# Patient Record
Sex: Female | Born: 1960 | Race: White | Smoking: Never smoker
Health system: Northeastern US, Academic
[De-identification: ages and names within clinical notes are randomized; demographics above are authoritative.]

## PROBLEM LIST (undated history)

## (undated) DIAGNOSIS — E119 Type 2 diabetes mellitus without complications: Secondary | ICD-10-CM

## (undated) HISTORY — PX: KNEE SURGERY: SHX244

## (undated) HISTORY — DX: Type 2 diabetes mellitus without complications: E11.9

---

## 2007-05-24 ENCOUNTER — Emergency Department (HOSPITAL_COMMUNITY): Admission: EM | Admit: 2007-05-24 | Discharge: 2007-05-25 | Payer: Self-pay | Admitting: Emergency Medicine

## 2008-03-12 ENCOUNTER — Emergency Department (HOSPITAL_COMMUNITY): Admission: EM | Admit: 2008-03-12 | Discharge: 2008-03-12 | Payer: Self-pay | Admitting: Emergency Medicine

## 2008-07-10 ENCOUNTER — Emergency Department (HOSPITAL_COMMUNITY): Admission: EM | Admit: 2008-07-10 | Discharge: 2008-07-10 | Payer: Self-pay | Admitting: Emergency Medicine

## 2008-07-26 ENCOUNTER — Other Ambulatory Visit: Payer: Self-pay

## 2008-07-26 ENCOUNTER — Encounter: Payer: Self-pay | Admitting: Cardiology

## 2008-08-05 ENCOUNTER — Encounter: Payer: Self-pay | Admitting: Cardiology

## 2008-08-06 ENCOUNTER — Encounter: Payer: Self-pay | Admitting: Cardiology

## 2008-08-06 ENCOUNTER — Other Ambulatory Visit: Payer: Self-pay | Admitting: Cardiology

## 2008-08-07 ENCOUNTER — Encounter: Payer: Self-pay | Admitting: Cardiology

## 2008-08-08 ENCOUNTER — Other Ambulatory Visit: Payer: Self-pay | Admitting: Cardiology

## 2008-08-08 ENCOUNTER — Encounter: Payer: Self-pay | Admitting: Cardiology

## 2008-08-09 ENCOUNTER — Encounter: Payer: Self-pay | Admitting: Cardiology

## 2008-08-10 ENCOUNTER — Encounter: Payer: Self-pay | Admitting: Cardiology

## 2008-08-17 ENCOUNTER — Encounter: Payer: Self-pay | Admitting: Cardiology

## 2008-08-18 ENCOUNTER — Encounter: Payer: Self-pay | Admitting: Cardiology

## 2008-08-19 ENCOUNTER — Encounter: Payer: Self-pay | Admitting: Cardiology

## 2008-08-23 ENCOUNTER — Encounter: Payer: Self-pay | Admitting: Cardiology

## 2008-10-09 ENCOUNTER — Other Ambulatory Visit: Payer: Self-pay

## 2008-10-24 ENCOUNTER — Other Ambulatory Visit: Payer: Self-pay | Admitting: Cardiology

## 2008-10-24 ENCOUNTER — Encounter: Payer: Self-pay | Admitting: Cardiology

## 2008-10-25 ENCOUNTER — Encounter: Payer: Self-pay | Admitting: Cardiology

## 2008-10-26 ENCOUNTER — Encounter: Payer: Self-pay | Admitting: Cardiology

## 2008-10-28 ENCOUNTER — Other Ambulatory Visit: Payer: Self-pay | Admitting: Cardiology

## 2008-10-28 ENCOUNTER — Encounter: Payer: Self-pay | Admitting: Cardiology

## 2008-10-29 ENCOUNTER — Encounter: Payer: Self-pay | Admitting: Cardiology

## 2008-10-31 ENCOUNTER — Encounter: Payer: Self-pay | Admitting: Cardiology

## 2008-11-01 ENCOUNTER — Encounter: Payer: Self-pay | Admitting: Cardiology

## 2008-11-02 ENCOUNTER — Encounter: Payer: Self-pay | Admitting: Cardiology

## 2008-11-03 ENCOUNTER — Encounter: Payer: Self-pay | Admitting: Cardiology

## 2008-11-04 ENCOUNTER — Encounter: Payer: Self-pay | Admitting: Cardiology

## 2008-11-05 ENCOUNTER — Encounter: Payer: Self-pay | Admitting: Cardiology

## 2008-11-10 ENCOUNTER — Encounter: Payer: Self-pay | Admitting: Cardiology

## 2008-11-12 ENCOUNTER — Encounter: Payer: Self-pay | Admitting: Cardiology

## 2008-11-13 ENCOUNTER — Encounter: Payer: Self-pay | Admitting: Cardiology

## 2008-11-14 ENCOUNTER — Encounter: Payer: Self-pay | Admitting: Cardiology

## 2008-11-15 ENCOUNTER — Encounter: Payer: Self-pay | Admitting: Cardiovascular Disease

## 2008-11-27 ENCOUNTER — Other Ambulatory Visit: Payer: Self-pay

## 2009-08-18 ENCOUNTER — Ambulatory Visit: Payer: Self-pay

## 2009-08-28 ENCOUNTER — Ambulatory Visit: Payer: Self-pay

## 2009-09-19 ENCOUNTER — Ambulatory Visit: Payer: Self-pay

## 2009-09-30 ENCOUNTER — Ambulatory Visit: Payer: Self-pay

## 2009-11-12 ENCOUNTER — Ambulatory Visit: Payer: Self-pay

## 2009-12-02 ENCOUNTER — Ambulatory Visit: Payer: Self-pay

## 2010-01-28 ENCOUNTER — Ambulatory Visit: Payer: Self-pay | Admitting: Ophthalmology

## 2010-02-04 ENCOUNTER — Ambulatory Visit: Payer: Self-pay

## 2010-02-23 ENCOUNTER — Ambulatory Visit: Payer: Self-pay

## 2010-03-02 ENCOUNTER — Ambulatory Visit: Payer: Self-pay

## 2010-03-13 ENCOUNTER — Ambulatory Visit: Payer: Self-pay

## 2010-03-30 ENCOUNTER — Ambulatory Visit: Payer: Self-pay

## 2010-04-09 ENCOUNTER — Ambulatory Visit: Payer: Self-pay

## 2010-04-27 ENCOUNTER — Ambulatory Visit: Payer: Self-pay | Admitting: Ophthalmology

## 2010-05-04 ENCOUNTER — Ambulatory Visit: Payer: Self-pay

## 2010-05-12 ENCOUNTER — Ambulatory Visit: Payer: Self-pay | Admitting: Ophthalmology

## 2010-05-25 ENCOUNTER — Ambulatory Visit: Payer: Self-pay

## 2010-06-24 ENCOUNTER — Ambulatory Visit: Payer: Self-pay | Admitting: Ophthalmology

## 2010-07-08 ENCOUNTER — Ambulatory Visit: Payer: Self-pay

## 2010-07-10 ENCOUNTER — Ambulatory Visit: Payer: Self-pay

## 2010-07-17 ENCOUNTER — Ambulatory Visit: Payer: Self-pay

## 2010-07-24 ENCOUNTER — Ambulatory Visit: Payer: Self-pay

## 2010-07-30 ENCOUNTER — Ambulatory Visit: Payer: Self-pay

## 2010-08-17 ENCOUNTER — Ambulatory Visit: Payer: Self-pay

## 2010-09-02 ENCOUNTER — Ambulatory Visit: Payer: Self-pay

## 2010-09-22 ENCOUNTER — Ambulatory Visit: Payer: Self-pay

## 2011-06-23 ENCOUNTER — Ambulatory Visit: Payer: Self-pay | Admitting: Ophthalmology

## 2011-06-23 ENCOUNTER — Encounter: Payer: Self-pay | Admitting: Ophthalmology

## 2011-06-23 DIAGNOSIS — H524 Presbyopia: Secondary | ICD-10-CM

## 2011-06-23 DIAGNOSIS — E11319 Type 2 diabetes mellitus with unspecified diabetic retinopathy without macular edema: Secondary | ICD-10-CM

## 2011-06-23 HISTORY — DX: Presbyopia: H52.4

## 2011-06-23 HISTORY — DX: Type 2 diabetes mellitus with unspecified diabetic retinopathy without macular edema: E11.319

## 2011-06-23 NOTE — Progress Notes (Signed)
Outpatient Visit      Patient name: Sherri Harrison  DOB: 03/03/1961       Age: 50 y.o.  MR#: [id number]    Encounter Date: 06/23/2011    Subjective:     Chief Complaint: No chief complaint on file.    HPI     50 year old returns to monitor presbyopia and diabetic retinopathy.  No   change.  Insulin dependent and on dialysis.  Did not fill last MR.          No current outpatient prescriptions on file. (Ophthalmic)     Current Outpatient Prescriptions (Other)   Medication Sig Dispense Refill   . Sevelamer Carbonate (RENVELA PO) Take by mouth                 is allergic to sulfa drugs.      Past Medical History   Diagnosis Date   . Presbyopia 06/23/2011   . Diabetic retinopathy 06/23/2011   . Diabetes mellitus         History reviewed. No pertinent past surgical history.   History   Smoking status   . Never Smoker    Smokeless tobacco   . Not on file      History   Alcohol Use: Not on file      History   Drug Use Not on file      Specialty Problems             Ophthalmology Problems    Diabetic retinopathy               ROS     Positive for: Endocrine, Eyes      Objective:     Base Ophthalmology Exam        Visual Acuity    Right Left   Dist cc 20/30 -2 20/40 +1   Method: Snellen - Linear   Correction: Glasses   Tonometry    Right Left   Pressure 18 20   Method: Tonopen   Time: 4:49 PM   Wearing Rx    Sphere Cylinder Axis   Right -2.25 +1.00 165   Left -2.75 +1.75 180   Type: SVL    Comments: Trial frame   Manifest Refraction    Sphere Cylinder Axis   Right -2.25 +1.00 165   Left -2.75 +1.75 180   Dilation   Both eyes: 2.5% Phenylephrine, 1.0% Tropicamide @ 4:50 PM   Pupils    Pupils   Right PERRLA   Left PERRLA   Visual Fields    Left Right   Result Full Full   Extraocular Movement    Right Left   Result Full Full   Final Rx    Sphere Cylinder Axis   Right -2.25 +1.00 165   Left -2.75 +1.75 180    Comments: Single vision lenses      Main Ophthalmology Exam        External Exam    Right Left   External Normal Normal      Slit Lamp Exam    Right Left   Lids/Lashes Normal Normal   Conjunctiva/Sclera White and quiet White and quiet   Cornea Clear Clear   Anterior Chamber Deep and quiet Deep and quiet   Iris Round and reactive Round and reactive   Lens Clear Clear   Vitreous Normal Normal   Fundus Exam    Right Left   Disc Normal Normal   C/D Ratio 0.3 0.3  Macula Normal Normal   Vessels Normal Normal   Periphery Laser scars Normal       Neuro/Psych   Oriented x3: Yes   Mood/Affect: Normal         Final Rx    Sphere Cylinder Axis   Right -2.25 +1.00 165   Left -2.75 +1.75 180    Comments: Single vision lenses              No annotated images are attached to the encounter.      Assessment/Plan:     Assessment:   1. Presbyopia    2. Diabetic retinopathy         Had severe diabetic retinopathy during pregnancy and had PRP done in 1980.  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  There is no active retinopathy  Recommend  MR single vision lenses  If contacts may need mono vision

## 2011-07-26 LAB — DIFFERENTIAL
Basophils Relative: 1
Eosinophils Absolute: 0
Monocytes Absolute: 0.5
Monocytes Relative: 5

## 2011-07-26 LAB — HEPATIC FUNCTION PANEL
Bilirubin, Direct: 0.5 — ABNORMAL HIGH
Indirect Bilirubin: 0.1 — ABNORMAL LOW
Total Bilirubin: 0.6

## 2011-07-26 LAB — BASIC METABOLIC PANEL
CO2: 28
Chloride: 102
Creatinine, Ser: 0.55
GFR calc Af Amer: >60
Sodium: 135

## 2011-07-26 LAB — URINALYSIS, ROUTINE W REFLEX MICROSCOPIC
Glucose, UA: NEGATIVE
Ketones, ur: NEGATIVE
Leukocytes, UA: NEGATIVE
Protein, ur: NEGATIVE
Urobilinogen, UA: 0.2

## 2011-07-26 LAB — CBC
Hemoglobin: 13.3
MCHC: 34.2
MCV: 89.9
RBC: 4.34

## 2011-07-26 LAB — POCT CARDIAC MARKERS: Operator id: 1415

## 2011-07-26 LAB — LIPASE, BLOOD: Lipase: 22

## 2011-11-24 ENCOUNTER — Ambulatory Visit: Payer: Self-pay

## 2011-11-24 DIAGNOSIS — Z89519 Acquired absence of unspecified leg below knee: Secondary | ICD-10-CM

## 2011-11-24 NOTE — Progress Notes (Addendum)
Patient seen with daughter present for follow up on left transtibial prosthesis.    Patient reports low back pain 8/10, which she feels may be due to LLD or core atrophy.    Prosthesis:  Original doesn't hurt in the knee area, suspension sleeve appears worn.  She would like a new suspension sleeve (Ossur size 41).    New prosthesis:  Has not worn new prosthesis consistently since receiving it.  After wearing 20 min she feels pressure under the knee cap.  Feels that Trimlines are too high in the posterior and on the sides.  Most recently wore her new prosthesis about a month ago.  She said that the foot externally rotated while she was wearing it.  She said that she didn't know if the socket rotated as well, or if it was just the foot.  Wears a 3 ply sock, but hasn't worn more than that.  Encouraged patient to increase her sock ply as it is likely that her limb is going too far into the socket.  She said she only has a few single ply and three ply socks. New prosthesis was inspected and the rotation was secure.     Patient stood with her new prosthesis.  It appeared that the foot was outset, which may be causing her pain at the medial condyle.  The prosthesis was adducted distal to the socket to decrease the outset.  End range of the pyramid was utilized.  The foot was also everted to allow the foot to be flat on the floor.  The posterior trimline was cut down by 2 mm to provide relief.   Patient walked with the prosthesis and reported that it felt better.  She also requested that the gel liner from her old prosthesis be trimmed down in the back, as it has rolled over and is causing bunching in the popliteal fossa.  The liner was trimmed down in the posterior region to reduce the bunching.  Patient stated satisfaction with the adjustment.  All screws set to torque specifications and secured with loktite.    Patient to return for follow up in 3 weeks to assess alignment changes and socket fit.

## 2011-12-16 ENCOUNTER — Ambulatory Visit: Payer: Self-pay

## 2011-12-16 DIAGNOSIS — Z89519 Acquired absence of unspecified leg below knee: Secondary | ICD-10-CM

## 2011-12-16 NOTE — Progress Notes (Signed)
Patient seen with husband present for follow up on left transtibial prosthesis.    Subjective:    Patient stated:    -New prosthesis feels tall (maybe causing LBP), foot toed too far out, foot feels like it is off the floor in quiet standing and feels like she's being thrown forward.  -Old prosthesis needs suspension sleeve replaced and foot externally rotated  -would like a shorter prosthetic sock in 3 ply    Objective:    New prosthesis was assessed.  Foot was dorsiflexed by 1 turn.  Patient was able to stand quietly without the sensation of being thrown forward.  The foot appeared to be flat on the floor.  Toe out angle was assessed and reduced to match the sound side.  Patient stated that her old prosthesis feels appropriate for height.  Patient's height was assessed and her pelvis appeared level.  Both prostheses were taken and their heights were compared.  Old prosthesis measured 40 cm from floor to patellar tendon bar.  New prosthesis measured 41 cm.  Patient stated that she preferred the height of the old device.  Patient requested that the height of the new prosthesis be matched to that of the old one.  The new prosthesis was shortened by 1 cm to match the old prosthesis.  Patient stated that it felt too short.  Patient encouraged to wear the prosthesis and determine over the course of several days whether the height is appropriate.  Patient stated agreement with the plan.  All screws set to torque specs and secured with loctite.    Patient complained of socket pressure in the new prosthesis.  Patient encouraged to manage her sock ply more diligently and to use single ply socks for fine adjustments.  Patient stated understanding.    Old prosthesis was donned with a new Ossur Iceflex Endurance suspension sleeve in size 41 beige (part no. [id number]).  Patient stated concern that the foot of the old prosthesis was externally rotated.  The toeout angle was decreased to match the sound side.  Patient walked  several laps in the room and the socket appeared to rotate on her limb based on which direction she pivoted.  The patient requested that the foot be externally rotated.  The foot was restored to the initial degree of toeout as evidenced by a mark placed on the pylon and tube connector.  Patient stated satisfaction with the orientation of the foot.  Patient showed a valgus moment at the knee during stance.  Patient complained of pressure indicative of a varus moment.  Patient refused alignment adjustments.  All screws set to torque specs and secured with loctite.    Assessment:    Patient to benefit from alignment changes and sock ply management to improve fit and function of both prostheses.    Plan:    Patient to wear her prostheses and determine if adjustments were beneficial and return for follow up in 2-3 weeks.

## 2012-05-29 ENCOUNTER — Telehealth: Payer: Self-pay

## 2012-05-29 NOTE — Telephone Encounter (Signed)
Per [facility]sw[facility] pt called stating she has questions re: the program. No call back number left.

## 2012-06-06 ENCOUNTER — Other Ambulatory Visit (HOSPITAL_COMMUNITY)
Admission: RE | Admit: 2012-06-06 | Discharge: 2012-06-06 | Disposition: A | Discharge status: Home / Self Care | Payer: BC Managed Care – PPO | Source: Ambulatory Visit | Attending: Family Medicine | Admitting: Family Medicine

## 2012-06-06 DIAGNOSIS — Z124 Encounter for screening for malignant neoplasm of cervix: Secondary | ICD-10-CM | POA: Insufficient documentation

## 2012-06-27 ENCOUNTER — Encounter: Payer: Self-pay | Admitting: Ophthalmology

## 2012-06-27 ENCOUNTER — Ambulatory Visit: Payer: Self-pay | Admitting: Ophthalmology

## 2012-06-27 VITALS — BP 150/77 | HR 84

## 2012-06-27 DIAGNOSIS — E1029 Type 1 diabetes mellitus with other diabetic kidney complication: Secondary | ICD-10-CM

## 2012-06-27 DIAGNOSIS — E11319 Type 2 diabetes mellitus with unspecified diabetic retinopathy without macular edema: Secondary | ICD-10-CM

## 2012-06-27 NOTE — Progress Notes (Signed)
Outpatient Visit      Patient name: Sherri Harrison  DOB: 1961-09-10       Age: 51 y.o.  MR#: [id number]    Encounter Date: 06/27/2012    Subjective:     Chief Complaint:   Chief Complaint   Patient presents with   . Follow-up     1 year follow-up for presbyopia and DR per Dr. Gretel Acre     HPI    Follow-up Additional comments: 1 year follow-up for presbyopia and DR per   Dr. Kenlie Seki        Comments84 year old returns to monitor presbyopia and diabetic   retinopathy.  Patient states that she is not noticing any change in her   vision since she was here last visit.  She denies any ocular pain or   discomfort in either eye.  IDDM X 40 years BG range 68-321 over the past   two weeks.  Last Hgb A1c 5.7              is allergic to sulfa drugs.      Past Medical History   Diagnosis Date   . Presbyopia 06/23/2011   . Diabetic retinopathy 06/23/2011   . Diabetes mellitus         History reviewed. No pertinent past surgical history.   History   Smoking status   . Never Smoker    Smokeless tobacco   . Not on file      History   Alcohol Use: Not on file      History   Drug Use Not on file      Specialty Problems       Ophthalmology Problems    Diabetic retinopathy        Presbyopia        DM (diabetes mellitus) type I controlled with renal manifestation               ROS    Positive for: Endocrine (diabetes)    Negative for: Constitutional, Gastrointestinal, Neurological, Skin,   Genitourinary, Musculoskeletal, HENT, Cardiovascular, Eyes, Respiratory,   Psychiatric, Allergic/Imm, Heme/Lymph      Objective:     Base Eye Exam       Visual Acuity    Right Left   Dist cc 20/40 -2 20/30 -2   Dist ph cc 20/30-2 NI   Method: Snellen - Linear   Correction: Glasses   Tonometry    Right Left   Pressure 18 19   Method: Tonopen   Time: 2:45 PM   Pupils    Dark Light APD   Right 2 1.75 None   Left 3.25 2.75 None   Visual Fields    Left Right   Result Full Full   Extraocular Movement    Right Left   Result Full Full   Neuro/Psych   Oriented x3:  Yes   Mood/Affect: Normal   Dilation   Both eyes: 2.5% Phenylephrine @ 2:27 PM      Slit Lamp and Fundus Exam       External Exam    Right Left   External Normal Normal   Slit Lamp Exam    Right Left   Lids/Lashes Normal Normal   Conjunctiva/Sclera White and quiet White and quiet   Cornea Clear Clear   Anterior Chamber Deep and quiet Deep and quiet   Iris Round and reactive Round and reactive   Lens Trace Nuclear sclerosis, 1+ Posterior subcapsular cataract Clear, Trace Nuclear sclerosis,  1+ Posterior subcapsular cataract   Vitreous Normal Normal   Fundus Exam    Right Left   Disc Normal Normal   C/D Ratio 0.3 0.3   Macula Normal Normal   Vessels Normal Normal   Periphery Laser scars Normal      Refraction       Wearing Rx    Sphere Cylinder Axis   Right -2.25 +1.00 165   Left -2.75 +1.75 180   Type: SVL   Manifest Refraction    Sphere Cylinder Axis Dist   Right -2.50 +0.75 155 20/-   Left                    No annotated images are attached to the encounter.      Assessment/Plan:     Assessment:   1. Diabetic retinopathy    2. Presbyopia    3. DM (diabetes mellitus) type I controlled with renal manifestation    4. CRI (chronic renal insufficiency)       Presbyopia  No change in MRx    Diabetic retinopathy  Gestational proliferative change with unilateral PRP 1980  No proliferative disease    Diabetes with renal insufficiency  Peritoneal dialysis since 2008    Cataract  Minor   Only anatomically notable,  No functional limitation    Plan  Wants consider one eye for distance with lasik  Had done monovision lasik and to use the other eye for distance

## 2012-07-18 ENCOUNTER — Telehealth: Payer: Self-pay | Admitting: Ophthalmology

## 2012-07-18 NOTE — Telephone Encounter (Signed)
Left message for patient

## 2012-07-18 NOTE — Telephone Encounter (Signed)
Sherri Harrison doctor wants to put her on an asprin regimen.  Will this have a negative effect on her eyes?

## 2012-10-19 ENCOUNTER — Other Ambulatory Visit: Payer: Self-pay | Admitting: Gastroenterology

## 2012-10-19 ENCOUNTER — Encounter: Payer: Self-pay | Admitting: Registered"

## 2012-10-19 ENCOUNTER — Other Ambulatory Visit: Payer: Self-pay | Admitting: Nephrology

## 2012-10-19 ENCOUNTER — Encounter: Payer: Self-pay | Admitting: Transplant Surgery Liver and Kidney

## 2012-10-19 ENCOUNTER — Ambulatory Visit: Payer: Self-pay | Admitting: Nephrology

## 2012-10-19 VITALS — BP 183/81 | HR 107 | Ht 61.5 in | Wt 155.6 lb

## 2012-10-19 DIAGNOSIS — Z01818 Encounter for other preprocedural examination: Secondary | ICD-10-CM

## 2012-10-19 LAB — HEPATIC FUNCTION PANEL
ALT: 20 U/L (ref 0–35)
AST: 29 U/L (ref 0–35)
Albumin: 3.2 g/dL — ABNORMAL LOW (ref 3.5–5.2)
Alk Phos: 99 U/L (ref 35–105)
Bilirubin,Direct: <0.2 mg/dL (ref 0.0–0.3)
Bilirubin,Total: 0.4 mg/dL (ref 0.0–1.2)
Total Protein: 7.7 g/dL (ref 6.3–7.7)

## 2012-10-19 LAB — CBC AND DIFFERENTIAL
Baso # K/uL: 0.1 10*3/uL (ref 0.0–0.1)
Basophil %: 1.1 % (ref 0.1–1.2)
Eos # K/uL: 0.5 10*3/uL — ABNORMAL HIGH (ref 0.0–0.4)
Eosinophil %: 5.5 % (ref 0.7–5.8)
Hematocrit: 29 % — ABNORMAL LOW (ref 34–45)
Hemoglobin: 8.9 g/dL — ABNORMAL LOW (ref 11.2–15.7)
Lymph # K/uL: 2.1 10*3/uL (ref 1.2–3.7)
Lymphocyte %: 23.2 % (ref 19.3–51.7)
MCV: 90 fL (ref 79–95)
Mono # K/uL: 0.7 10*3/uL (ref 0.2–0.9)
Monocyte %: 8.1 % (ref 4.7–12.5)
Neut # K/uL: 5.5 10*3/uL (ref 1.6–6.1)
Platelets: 388 10*3/uL — ABNORMAL HIGH (ref 160–370)
RBC: 3.2 MIL/uL — ABNORMAL LOW (ref 3.9–5.2)
RDW: 15.1 % — ABNORMAL HIGH (ref 11.7–14.4)
Seg Neut %: 62.1 % (ref 34.0–71.1)
WBC: 8.8 10*3/uL (ref 4.0–10.0)

## 2012-10-19 LAB — ABO/RH: ABO RH Blood Type: O NEG

## 2012-10-19 LAB — APTT: aPTT: 29.7 s (ref 25.8–37.9)

## 2012-10-19 LAB — CREATININE, SERUM
Creatinine: 6.34 mg/dL — ABNORMAL HIGH (ref 0.51–0.95)
GFR,Black: 8 * — AB
GFR,Caucasian: 7 * — AB

## 2012-10-19 LAB — HIGH SENSITIVITY CRP: CRP,High Sensitivity: 69.7 mg/L — AB

## 2012-10-19 LAB — PROTIME-INR
INR: 1.3 — ABNORMAL HIGH (ref 1.0–1.2)
Protime: 13.9 s — ABNORMAL HIGH (ref 9.2–12.3)

## 2012-10-19 LAB — GLUCOSE: Glucose: 79 mg/dL (ref 60–99)

## 2012-10-19 LAB — CHOLESTEROL, TOTAL: Cholesterol: 162 mg/dL

## 2012-10-19 NOTE — Progress Notes (Signed)
Document date:  10/19/2012  Date last modified:  10/20/2012  ATTRIBUTES   Vital Statistics   Sex female   Height 156 cm (61.5 inches)   Weight 70.6 kg (155 lbs)   BMI 28.9 overweight   IBW 108 kg (49 kg) +/-10% adjusted for Left Leg amputation prothesis weighs= 5.4 lbs   Pre-illness Weight 140's lbs    Overall weight change in the past 6 months Unintentional 20 lbwt gain w/ PD 175 lbs  Wasn't planning on weight loss in past 70mo (peritonitis n/v & fluid losses)   Medical   Recent labs reviewed []  N/A   [x]  Yes   []  No    Results for Sherri, Harrison (MRN [id number]) as of 10/20/2012 14:25   Ref. Range 10/19/2012 13:53   Creatinine Latest Range: 0.51-0.95 mg/dL 4.54 (H)   Glucose Latest Range: 60-99 mg/dL 79   Albumin Latest Range: 3.5-5.2 g/dL 3.2 (L)   ALT Latest Range: 0-35 U/L 20   AST Latest Range: 0-35 U/L 29   Alk Phos Latest Range: 35-105 U/L 99   Bilirubin,Direct Latest Range: 0.0-0.3 mg/dL <0.9   Bilirubin,Total Latest Range: 0.0-1.2 mg/dL 0.4   Cholesterol No range found 162   C-Peptide Latest Range: 1.1-4.4 ng/mL <0.1 (L)   Protime Latest Range: 9.2-12.3 sec 13.9 (H)   INR Latest Range: 1.0-1.2  1.3 (H)   aPTT Latest Range: 25.8-37.9 sec 29.7   Hemoglobin A1C Latest Range: 4.0-6.0 % 5.9        Current Medications Current Outpatient Prescriptions   Medication   . atorvastatin (LIPITOR) 20 MG tablet   . insulin NPH (HUMULIN N,NOVOLIN N) 100 UNIT/ML injection vial   . B Complex-C-Zn-Folic Acid (NEPHPLEX RX PO)   . WARFARIN SODIUM   . omeprazole (PRILOSEC) 20 MG capsule   . promethazine (PHENERGAN) 12.5 MG tablet   . Insulin Regular Human (HUMULIN R IJ)   . Sevelamer Carbonate (RENVELA PO)     No current facility-administered medications for this visit.      Diagnosis   Liver Disease []  N/A   []  Yes   [x]  No   Kidney []  N/A   [x]  Yes   []  No    DM Nephropathy   PTDM [x]  N/A   []  Yes   []  No   NIDDM []  N/A   []  Yes   [x]  No   IDDM []  N/A   [x]  Yes   []  No   HTN []  N/A   [x]  Yes   []  No   CAD []  N/A   [x]  Yes   []  No    Cabg x 2 2010   CHF []  N/A   []  Yes   [x]  No   CVA []  N/A   []  Yes   [x]  No   COPD []  N/A   []  Yes   [x]  No   CA []  N/A   []  Yes   [x]  No   CRF []  N/A   [x]  Yes   []  No   HD []  N/A   [x]  Yes   []  No   PD []  N/A   [x]  Yes   []  No   RI [x]  N/A   []  Yes   []  No   GERD []  N/A   []  Yes   [x]  No   Other/Comment(s) Left leg amputation, right toes amputation parathyroidectomy, surgical debridement of sacral ulcer 09', peritonitis, DM retinopathy       Regimen/Education History   Current Regimen  Carbohydrate counts, Low Na, Low K, Low Phos increased protein Fld. Restriction   Diet education/printed materials []  N/A   [x]  Yes   []  No   Provided by: PD & HD RD;    Comment(s) 2-3 times per day bg monitoring, carb 1:10 counting. 2 meals per day b/l and dinner; Likes most foods, good with water, no longer drinking diet soda; 1% milk & yogurt.   Adherence []  N/A   [x]  Yes   []  No       Estimated Nutritional Needs   kcal 1300-1400   gm protein 60-75   ml fluid 1400       Medical History   Swallowing difficulties/varices/GIB []  N/A   []  Yes   [x]  No   Chewing difficulty []  N/A   []  Yes   [x]  No   Poor dentition-missing, broken teeth, cavities, dentures []  N/A   []  Yes   [x]  No   Mouth sores, ulcers, bleeding gums []  N/A   []  Yes   [x]  No   Recurrent infections []  N/A   [x]  Yes   []  No   Unable or unwilling to prepare meals []  N/A   []  Yes   [x]  No   Early satiety []  N/A   [x]  Yes   []  No   Taste alterations (ck for Zn, Mg deficiency) []  N/A   []  Yes   [x]  No   Vitamins/Minerals []  N/A   [x]  Yes   []  No   Herbal remedies/supplements []  N/A   []  Yes   [x]  No   N/V []  N/A   [x]  Yes   []  No   Abdominal pain/ulcer/gastroparesis/diverticulosis []  N/A   []  Yes   [x]  No   Ascities [x]  None   []  Mild   []  Moderate   []  Severe   Edema [x]  None   []  Mild   []  Moderate   []  Severe   Encephalopathy [x]  None   []  Stage I-II   []  Stage III   []  Stage IV   Food allergies/intolerance []  N/A   []  Yes   [x]  No   Specify:    Living situation Spouse =  Sherri Harrison (pysch RN @ [employer]) 2 daughters Sherri Harrison & Sherri Harrison, sister= Sherri Harrison (RN)   Education/occupation High school and some college: [employer]. [employer]. Not working; was working as a Information systems manager.   Appetite   Decreased []  N/A   [x]  Yes   []  No   For how many wks/months? 1 yr   Po intake decreased   Comment(s)        Skin   Sores []  N/A   []  Yes   [x]  No   Ulcers []  N/A   []  Yes   [x]  No   Spider angiomas [x]  N/A   []  Yes   []  No   Discolorations []  N/A   [x]  Yes   []  No   On coumadin & in general   Itching []  N/A   []  Yes   [x]  No       Bowel Activity   Diarrhea []  N/A   [x]  Yes   []  No On ABX tx was taking probiotic Culturell helped "a lot"   Steatorrhea [x]  N/A   []  Yes   []  No     Constipation []  N/A   []  Yes   [x]  No   # of stools/day/more than 3 days duration Baseline BM per day       Functional  Capacity   Normal []  N/A   []  Yes   [x]  No   Occasional difficulty w/ baseline activities []  N/A   []  Yes   [x]  No   Increase in fatigue []  N/A   [x]  Yes   []  No   Daily difficulty w/ independent activities []  N/A   [x]  Yes   []  No   Bed or chair ridden w/ little or no activity []  N/A   []  Yes   [x]  No   Inactive []  N/A   [x]  Yes   []  No   Moderately Active []  N/A   []  Yes   [x]  No   Active []  N/A   []  Yes   [x]  No   Exercise regimen Some weights at home currently not doing anything   Physical   Muscle Wasting [x]  None   []  Mild   []  Moderate   []  Severe   Status of subcutaneous fat [x]  Good stores   []  Fair stores   []  Poor stores   Degree of Malnutrition [x]  Well nourished  []  Mildly malnourished   []  Moderately malnourished   []  Severely malnourished   Assessment/Plan Altered nutrition related labs related to kidney function as evidenced by ESRD, abnormal renal panel, had been receiving PD switched to HD d/t peritonitis.  Following renal diet, reports from dialysis within normal limits.  Has good control over blood glucoses with carb counting, regular monitoring and A1C 5.9.  Reports good appetite, however has noticed  since peritonitis and initiation of HD has experienced early satiety, albumin 3.2 which is abnormal.         Diet Education   Completed [x]  N/A   []  Yes   []  No   Written materials [x]  N/A   []  Yes   []  No   Writer phone number provided [x]  N/A   []  Yes   []  No   Able to demonstrate knowledge []  N/A   [x]  Yes   []  No   Expected adherence good   Post transplant diet education basics reviewed []  N/A   [x]  Yes   []  No   Printed diet education given []  N/A   [x]  Yes   []  No   Patients and/or supports verbalize comprehension []  N/A   [x]  Yes   []  No   Comment(s) Encouraged to increase po intakes of protein discussed cold temperature foods, small meals/snacks.  Patient will be followed by dialysis RD who has goal to improve albumin levels.       Information   Assessment completed by: Lyndal Rainbow, RDN, CNSC

## 2012-10-19 NOTE — Comprehensive Assessment (Signed)
Kidney Transplant Psychosocial Assessment     Transplant Coordinator:  Glean Hess, NP   Communication style/ Mental status: alert and oriented x 3, pleasant and cooperative and concentration/judgement good   History of mental health symptoms: No reported history of mental health symptomatology   Primary language: English Interpreter required?  No     Adherence: no known adherence challenges   Support Plan and Childcare Plan after Transplant:     Other Current Stressors: Medical issues have impacted the pt's QOL.  She has been adjusting well.    Advance Directive Status: Provided form to pt.        Summary:   Pt is a 52 y.o. Married female who presented for a kidney transplant evaluation with her spouse Algernon Huxley, sister Britta Mccreedy and daughters Candise Bowens  26 and Joni Reining 30.  Pt has a diagnoses of DM Type I, HTN; left leg amputation in 2009. Pt has been on PD since 2006; recently on HD.   [place] resides in [place] with spouse and two daughters.  Daughter Joni Reining is moving ou next month.  Pt resides in a two storey home.  She reports no difficulties with ambulation and is independet with her ADLs.  Pt is on SSD.  Her spouse works as an Charity fundraiser at [employer] in the psychiatry department.  He works in the evenings.    No CD or MH issues.  Pt presents as very motivated and resilient.  Pt says her faith is very important to her.  Family is very supportive.  Pt indicates she had a very active life until having health problems several years ago.  No issues with compliance.    Insurance is Medicare and [employer] is secondary and under her spouse.   Support will be spouse, children and sister.  Pt provided information on the transplant recovery process.      Please see [facility] FLOWSHEET pasted below for further detail. Pt is a 52 y.o. Married female who presented with spouse, sister and children for a transplant evaluation.  Pt resides in [place] with spouse and children.  She is on SSD; spouse is an Charity fundraiser at [employer].  No CD or MH  issues.  Pt appears to be coping well.  Support will be her spouse and children.  No barriers to transplant.        Plan:   Social worker to provide supportive services as needed.  Patient's psychosocial evaluation will be presented to the Transplant Selection Committee, when/where a formal team decision will be made regarding the patient's overall transplant candidacy.     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[employer])   Income situation Engineer, maintenance  (ex[employer] under spouse)   Prescription Coverage Y   Prescription Coverage Type co-pays   Veteran N   HOUSING   Type of home 2-Story home   Home geography  Steps in the home;Steps to enter the home;Bedroom on 2nd floor;Bathroom on 2nd floor   Lives with Spouse;Child  (cat frankie)   ACTIVITIES OF DAILY LIVING   Transfers Independent   Assistive device none   Ambulation Independent   Bathing/Grooming Independent   Nutrition Independent   Household management Independent   Does patient currently have home care services? (had home care in past - 2009; lifetime care)   Recreation/Hobbies Reading  (listen to music)   SUBSTANCE HISTORY   Substance abuse history No   History of Tobacco Use N   History of Alcohol Use N   History of Drug Use N   Has patient been prescribed pain medications? N   Concerns about prescription drugs misuse, abuse? N   Substance abuse impact N    Chemical Dependency Rehabilitation Programs History N   Substance abuse progress N/A   MENTAL HEALTH HISTORY   Mental Health Issues (Patient-Reported) None reported   History of mental health counseling No   Medications No   Medications prescribed by N/A   Mental health inpatient stays No   Suicide attempts No   Self harm No   DIALYSIS HISTORY   Dialysis Y   Dialysis status Current   Dialysis type Hemodialysis  (PD)   Dialysis location [facility]   COPING/COMPLIANCE   Coping Appropriately concerned;Interested in pursuing transplant   Compliance Willing to commit to post transplant care regimen including: need for regular lab work, ongoing clinic appointment and importance of medication adherence   SUPPORT/TRANSPORTATION   Family or support system availability Appears to have an excellent support system for post transplant care   Transportation for post transplant care Family/friend   RISK SUMMARY   Patient appears to have a good understanding of the risks and benefits associated with transplant Yes   Overall risk Low risk   Needs further evaluation No   Barriers to transplant None at this time   REFERRALS   Referrals made None at this time   Time Spent   Time Spent with Patient (min) 90

## 2012-10-19 NOTE — H&P (Addendum)
Kidney Transplant Evaluation  Sherri Harrison was seen for a Kidney transplant evaluation.      Referring Nephrologist is Dr Karsten Ro, she is currently under the care of Dr Jonny Ruiz Hix  Primary care physician is Dr Joaquin Music.   Cardiologist is Dr Cristine Polio    HPI   Sherri Harrison is a 52 y.o. year old White female with end stage renal disease secondary to diabetes.  She has been a Type I diabetic since age 60.  Currently her blood glucoses are well controlled, she denies hypoglycemic unawareness or hypoglycemia.  She began peritoneal dialysis in 2006 and transitioned recently to hemodialysis after an episode of peritonitis.  She reports this was her one and only episode.  She plans to remain on hemodialysis and is hoping for a living donor kidney transplant.  She is also interested in pancreas transplantation.  Family members have expressed interest in living kidney donation.  Her additional significant past medical history is outlined below.     Past Medical History:  1. Diabetes, insulin dependent, since age 77. She reports recent HgbA1c of 6.8, but has been as low as 5.4.  She denies hypoglycemic unawareness.  Patient's family and she report she controls her diabetes very well by counting carbs and controlling her insulin.  Her PCP manages this.    2. Hypertension, 30 year history. She is currently not on antihypertensives.  BPs average 120/70s  3. Cardiac history:      NSTEMI 2010      CABG x 2 2010 at [facility], Dr Terrilee Croak.        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  She did undergo parathyroidectomy in 2009.\  5. Atrial fibrillation - she had one episode of rapid AF November 2013, she was started on Coumadin    OBGYN History: G4P2; she is post menopausal.  Pap smear 08/07/12 negative for intraepithelial lesion or malignancy.  Mammogram 08/11/12 Benign     TRAUMA HISTORY:    Metal in body: none    TRANSFUSION HISTORY   Reports she has received multiple blood transfusions, last transfusions were in December 2013    Past Surgical History   Procedure Laterality Date   . Tonsillectomy  10/1979   . Cesarean section, low transverse  1983 and 1988   . Parathyroidectomy  2009     Dr Corine Shelter at [facility]   . Other surgical history  2009     surgical debridement of sacral decubitis ulcer   . Other surgical history  2006     insertion of PD catheter   . Cardiac surgery  2010     CABG x2 - left internal mammary to LAD ; T graft to vein from IMA   . Leg amputation below knee Left 08/2008   . 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 Lisinopril Other (See Comments)     Lightheadedness, weakness, hypotension     .  Sulfa Drugs    . Ciprofloxacin Swelling and Rash       Current Outpatient Prescriptions   Medication Sig Dispense Refill   . atorvastatin (LIPITOR) 20 MG tablet Take 20 mg by mouth daily (with dinner)       . insulin NPH (HUMULIN N,NOVOLIN N) 100 UNIT/ML injection vial Inject 6 Units into the skin 2 times daily (before meals)       . B Complex-C-Zn-Folic Acid (NEPHPLEX RX PO) Take by mouth       . omeprazole (PRILOSEC) 20 MG capsule Take 20 mg by mouth daily (before breakfast)       . Insulin Regular Human (HUMULIN R IJ) Inject as directed       . Sevelamer Carbonate (RENVELA PO) Take 800 mg by mouth   Take with high phosphorus       . WARFARIN SODIUM By no specified route       . promethazine (PHENERGAN) 12.5 MG tablet Take 12.5 mg by mouth every 4-6 hours as needed         No current facility-administered medications for this visit.       Family History:  Mother: 19  living, Lyme disease, Meningioma  Father: deceased age 81 thyroid cancer, HTN  Siblings: Sherri Harrison 53, HTN, AV nodal tachycardia, BCC; Sherri Harrison 48, scleroderma  Offspring: Sherri Harrison 30, healthy; Sherri Harrison 25, healthy    Social History:  Citizenship: [place]   Education: college  Employment: Disability, was an Programmer, multimedia  Residence: Lives with [place] and daughter  Spouse/Support system: Sherri Harrison, additional support include her sister Sherri Harrison and two daughters  Hobbies: reading and listening to music  Religion: Actor Service: No  Substance Abuse: Denies    ROS   CANCER history: negative   INFECTION history: negative  HEENT: recent dental visit 07/31/12, not in need of any dental work  PULM: she denies history of pulmonary disease, she denies SOB, cough  CARD: denies chest pain, palpitations  GI: unremarkable  GU: no UTI history, no voiding dysfunction or history of kidney stones.  She voids a few ounces once per day  MUSCULO-SKELETAL:  Denies joint pain or gout  PVD: no claudication symptoms; she has a history of lower extremity amputations, currently without ulcers  ENDO: she is a diabetic  HEME: no personal or family history of bleeding/clotting disorders; patient has never had a blood clot  MENTAL HEALTH: no history of depression or anxiety  EXERCISE TOLERANCE: able to walk one mile and climb one flight of stairs    Results   Recent Results (from the past 336 hour(s))   HEPATIC FUNCTION PANEL    Collection Time    10/19/12  1:53 PM       Result Value Range    Total Protein 7.7  6.3 - 7.7 g/dL    Albumin 3.2 (*) 3.5 - 5.2 g/dL    Bilirubin,Total 0.4  0.0 - 1.2 mg/dL    Bilirubin,Direct <1.6  0.0 - 0.3 mg/dL    Alk Phos 99  35 - 109 U/L    AST 29  0 - 35 U/L    ALT 20  0 - 35 U/L   CHOLESTEROL, TOTAL    Collection Time    10/19/12  1:53 PM       Result Value Range    Cholesterol 162     CREATININE, SERUM    Collection Time    10/19/12  1:53 PM       Result Value Range  Creatinine 6.34 (*) 0.51 - 0.95 mg/dL    GFR,Caucasian 7 (*)      GFR,Black 8 (*)    GLUCOSE, RANDOM    Collection Time    10/19/12  1:53 PM       Result Value Range    Glucose 79  60 - 99 mg/dL   ABO/RH    Collection Time    10/19/12  1:53 PM       Result Value Range    ABO RH Blood Type O RH NEG     CBC AND DIFFERENTIAL    Collection Time    10/19/12  1:53 PM       Result Value Range    WBC 8.8  4.0 - 10.0 THOU/uL    RBC 3.2 (*) 3.9 - 5.2 MIL/uL    Hemoglobin 8.9 (*) 11.2 - 15.7 g/dL    Hematocrit 29 (*) 34 - 45 %    MCV 90  79 - 95 fL    RDW 15.1 (*) 11.7 - 14.4 %    Platelets 388 (*) 160 - 370 THOU/uL    Seg Neut % 62.1  34.0 - 71.1 %    Lymphocyte % 23.2  19.3 - 51.7 %    Monocyte % 8.1  4.7 - 12.5 %    Eosinophil % 5.5  0.7 - 5.8 %    Basophil % 1.1  0.1 - 1.2 %    Neut # K/uL 5.5  1.6 - 6.1 THOU/uL    Lymph # K/uL 2.1  1.2 - 3.7 THOU/uL    Mono # K/uL 0.7  0.2 - 0.9 THOU/uL    Eos # K/uL 0.5 (*) 0.0 - 0.4 THOU/uL    Baso # K/uL 0.1  0.0 - 0.1 THOU/uL   PROTIME-INR    Collection Time    10/19/12  1:53 PM       Result Value Range    Protime 13.9 (*) 9.2 - 12.3 sec    INR 1.3 (*) 1.0 - 1.2   APTT    Collection Time    10/19/12  1:53 PM       Result Value Range    aPTT 29.7  25.8 - 37.9 sec   HIGH SENSITIVITY CRP    Collection Time    10/19/12  1:53 PM       Result Value Range    CRP,High Sensitivity 69.7 (*)         Vital Signs   Blood pressure 183/81, pulse 107, height 1.562 m (5' 1.5"), weight 70.58 kg (155 lb 9.6 oz).    Physical Exam   On exam, the patient appears chronically ill but overall well. The oropharynx is clear without thrush, apthous ulcers or gingivitis. The neck is without anterior cervical, posterior cervical, or submandibular lymphadenopathy. The carotid pulses were palpable and without bruits. The cardiac examination shows a regular rate and a regular rhythm without gallops, rubs or murmurs. The lungs are clear to auscultation and percussion. A breast exam was normal. The abdomen is soft, non-tender and non-distended without hepatosplenomegally; umbilical hernia is  present. The skin is without rashes or masses.  Left BKA, stump intact, no ulcerations; right TMA, skin intact, no ulcerations, PT pulse palpable; 2+ bilateral femoral that are palpable.  Neurological examinations shows the patient to be alert and oriented x3, cranial nerves 2-12 grossly intact, reflexes 2+ throughout, and gait without abnormalities. Pin prick sensation intact in both feet. Dialysis access: right IJ tunnelled catheter.  Assessment   This is a 52 year old White female with end stage renal disease due to diabetes.  We have recommended some additional tests and evaluations to complete her transplant evaluation which is outlined in the plan below. We feel she will be a good candidate for kidney transplantation however she does have significant vascular disease.  We will review her candidacy for pancreas transplantation with the surgeons and transplant team.    The patient was seen by the following members of the Kidney and Pancreas Transplant Team:   Nurse Practitioner: Glean Hess, FNP   Transplant Nephrologist: Darylene Price, MD   Social Worker: Larena Glassman   Nutritionist: Mingo Amber   Financial Coordinator: Karoline Caldwell    Plan   To complete Sherri Harrison kidney transplant evaluation the following is required:     1. She will need CT imaging of the abdomen and pelvis to evaluate her vasculature, will review with our transplant surgeons  2. Pulmonary Function Tests  3. carotid artery ultrasounds   4. Labs, CXR and EKG were completed today.  A Cpeptide was drawn to assess for pancreatic function.     Interested potential living donors are encouraged to go to our donor website to begin the donor process: [email or web address]    Transplant Recipient Evaluation Discussion: We discussed the process of the transplant evaluation, various tests that would be required, the financial clearance that we would need to obtain from the patients insurance company, the medical risks of  transplantation, the desirability of living donor transplantation, the [facility] transplant waiting list and how this process functioned. We indicated that the surgical risks of transplantation would be discussed at their appointment with the transplant surgeon, as would details of the surgical approach. We also discussed the need for periodic health updates, the necessity for notifying the [facility] of changes in health status, insurance status, or contact information. We discussed that the requirement of the Transplant Center to provide the patient with written confirmation of their placement on the [facility] waiting list, or any changes in their wait list status such as being placed on hold (Status 7) or removed from the transplant list. We indicated that listing for a transplant was not a guarantee that a transplant would take place in the future, and that medical or other circumstances might result in removal from the waiting list. We further discussed that any information regarding the evaluation of living donor medical records was confidential, and that the recipient would only be told that the living donor was being evaluated, was approved for donation, or was not suitable to donate. We discussed that accessing the living donors medical records, either directly through the electronic medical record or indirectly by asking another individual (healthcare provider, acquaintance, etc.) would be a violation of HIPAA, the policies of the [facility] transplant program, and could potentially result in the patient being removed from the [facility] transplant list and advised to seek transplantation at another center. The patient was provided with a detailed set of documents regarding transplantation, the [facility] statistics on transplant outcomes at [facility], and the Consent for Transplant Evaluation form. They were given an opportunity to ask questions, which were addressed.     Thank you very much for allowing me to care for your  patient. If you have any questions, do not hesitate to call or contact our Kidney Transplant Program at [phone].      Best regards,    Glean Hess, MS, RN, FNP-BC

## 2012-10-20 LAB — HEPATITIS B SURFACE ANTIGEN: HBV S Ag: NEGATIVE

## 2012-10-20 LAB — HIV 1&2 ANTIGEN/ANTIBODY: HIV 1&2 ANTIGEN/ANTIBODY: NONREACTIVE

## 2012-10-20 LAB — HEPATITIS B SURFACE ANTIBODY
HBV S Ab Quant: 2.01 m[IU]/mL
HBV S Ab: NEGATIVE

## 2012-10-20 LAB — EPSTEIN-BARR VIRUS VCA, IGG: EBV IgG: POSITIVE

## 2012-10-20 LAB — HEPATITIS B CORE ANTIBODY, TOTAL: HBV Core Ab: NEGATIVE

## 2012-10-20 LAB — SYPHILIS SCREEN
Syphilis Screen: NEGATIVE
Syphilis Status: NONREACTIVE

## 2012-10-20 LAB — CYTOMEGALOVIRUS IGG AB: CMV IgG: NEGATIVE

## 2012-10-20 LAB — HEPATITIS C ANTIBODY: Hep C Ab: NEGATIVE

## 2012-10-20 LAB — C-PEPTIDE: C-Peptide: <0.1 ng/mL — ABNORMAL LOW (ref 1.1–4.4)

## 2012-10-20 LAB — HEMOGLOBIN A1C: Hemoglobin A1C: 5.9 % (ref 4.0–6.0)

## 2012-10-20 LAB — HEB B INT

## 2012-10-22 LAB — NICOTINE & METABOLITE
3-OH-Cotinine: <2 ng/mL
Cotinine: <2 ng/mL
Nicotine: <2 ng/mL

## 2012-10-22 LAB — EKG 12-LEAD
P: 55 degrees
QRS: 72 degrees
Rate: 99 {beats}/min
Severity: NORMAL
Severity: NORMAL
T: -8 degrees

## 2012-10-23 ENCOUNTER — Ambulatory Visit: Payer: Self-pay

## 2012-10-23 ENCOUNTER — Encounter: Payer: Self-pay | Admitting: Transplant Surgery Liver and Kidney

## 2012-10-23 ENCOUNTER — Ambulatory Visit: Payer: Self-pay | Admitting: Transplant Surgery Liver and Kidney

## 2012-10-23 VITALS — BP 184/79 | HR 76 | Temp 95.2°F | Resp 18 | Ht 61.0 in | Wt 151.4 lb

## 2012-10-23 DIAGNOSIS — N186 End stage renal disease: Secondary | ICD-10-CM

## 2012-10-23 LAB — DRUG SCREEN, SERUM
Amphetamines: NOT DETECTED
Barbiturates: NOT DETECTED
Benzodiazepines: NOT DETECTED
Cannabinoids: NOT DETECTED
Cocaine (Metabolite): NOT DETECTED
Methadone: NOT DETECTED
Opiates: NOT DETECTED
Phencyclidine: NOT DETECTED
Propoxyphene: NOT DETECTED

## 2012-10-23 LAB — VARICELLA ZOSTER IGG AB: VZV IgG: POSITIVE

## 2012-10-23 LAB — HSV 2 ANTIBODY, IGG: HSV 2 IgG: 0.8 IV

## 2012-10-23 LAB — HSV 1 ANTIBODY, IGG: HSV 1 IgG: 0.6 IV

## 2012-10-23 NOTE — Progress Notes (Signed)
Solid Organ Transplant: Kidney Recipient Evaluation    HPI:    Sherri Harrison is a 52 y.o. female in the Kidney Transplant Evaluation clinic for kidney transplant. end stage renal disease secondary to diabetes. She has been a Type I diabetic since age 1. Currently her blood glucoses are well controlled, she denies hypoglycemic unawareness or hypoglycemia. She began peritoneal dialysis in 2006 and transitioned recently to hemodialysis after an episode of peritonitis. She reports this was her one and only episode. She plans to remain on hemodialysis and is hoping for a living donor kidney transplant. She is also interested in pancreas transplantation. Family members have expressed interest in living kidney donation. Her additional significant past medical history is outlined below.             Past Medical History   Diagnosis Date   . Presbyopia 06/23/2011   . Diabetic retinopathy 06/23/2011   . Diabetes mellitus        Past Surgical History   Procedure Laterality Date   . Tonsillectomy  10/1979   . Cesarean section, low transverse  1983 and 1988   . Parathyroidectomy  2009     Dr Corine Shelter at [facility]   . Other surgical history  2009     surgical debridement of sacral decubitis ulcer   . Other surgical history  2006     insertion of PD catheter   . Cardiac surgery  2010     CABG x2 - left internal mammary to LAD ; T graft to vein from IMA   . Leg amputation below knee Left 08/2008   . 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    Allergies   Allergen Reactions   . 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 Lisinopril Other (See Comments)     Lightheadedness, weakness, hypotension     . Sulfa Drugs    . Ciprofloxacin Swelling and Rash       Current outpatient prescriptions:atorvastatin (LIPITOR) 20 MG tablet, Take 1 tablet by mouth every evening., Disp: , Rfl: ;  insulin regular (HUMULIN R) 100 UNIT/ML injection vial, Inject 4-10 units as directed. MDD 30  units, Disp: , Rfl: ;  B Complex-C-Zn-Folic Acid (NEPHPLEX RX) TABS, TAKE 1 TABLET BY MOUTH ONCE A DAY., Disp: , Rfl: ;  Omeprazole (RA OMEPRAZOLE) 20 MG TBEC, Take 1 tablet by mouth daily., Disp: , Rfl:   insulin NPH (HUMULIN N,NOVOLIN N) 100 UNIT/ML injection vial, Inject 7 Units into the skin 2 (two) times daily before meals., Disp: , Rfl: ;  atorvastatin (LIPITOR) 20 MG tablet, Take 20 mg by mouth daily (with dinner), Disp: , Rfl: ;  insulin NPH (HUMULIN N,NOVOLIN N) 100 UNIT/ML injection vial, Inject 6 Units into the skin 2 times daily (before meals), Disp: , Rfl: ;  B Complex-C-Zn-Folic Acid (NEPHPLEX RX PO), Take by mouth, Disp: , Rfl:   WARFARIN SODIUM, By no specified route, Disp: , Rfl: ;  omeprazole (PRILOSEC) 20 MG capsule, Take 20 mg by mouth daily (before breakfast), Disp: , Rfl: ;  promethazine (PHENERGAN) 12.5 MG tablet, Take 12.5 mg by mouth every 4-6 hours as needed, Disp: , Rfl: ;  Insulin Regular Human (HUMULIN R IJ), Inject as directed, Disp: , Rfl: ;  Sevelamer Carbonate (RENVELA PO), Take 800 mg by mouth   Take with high phosphorus, Disp: , Rfl:  History     Social History   . Marital Status: Married     Spouse Name: N/A     Number of Children: N/A   . Years of Education: N/A     Occupational History   . Not on file.     Social History Main Topics   . Smoking status: Never Smoker    . Smokeless tobacco: Not on file   . Alcohol Use: Not on file   . Drug Use: Not on file   . Sexually Active: Not on file     Other Topics Concern   . Not on file     Social History Narrative   . No narrative on file         Review of Systems -  CANCER history: negative   INFECTION history: negative   HEENT: recent dental visit 07/31/12, not in need of any dental work   PULM: she denies history of pulmonary disease, she denies SOB, cough   CARD: denies chest pain, palpitations   GI: unremarkable   GU: no UTI history, no voiding dysfunction or history of kidney stones. She voids a few ounces once per day    MUSCULO-SKELETAL: Denies joint pain or gout   PVD: no claudication symptoms; she has a history of lower extremity amputations, currently without ulcers   ENDO: she is a diabetic   HEME: no personal or family history of bleeding/clotting disorders; patient has never had a blood clot   MENTAL HEALTH: no history of depression or anxiety   EXERCISE TOLERANCE: able to walk one mile and climb one flight of stairs      Objective:    BP 184/79  Pulse 76  Temp(Src) 35.1 C (95.2 F) (Temporal)  Resp 18  Ht 1.549 m (5\' 1" )  Wt 68.675 kg (151 lb 6.4 oz)  BMI 28.62 kg/m2  SpO2 98%  General appearance: alert, appears stated age and no distress.  Eyes: conjunctivae/corneas clear. PERRL, EOM's intact.  Neck: no adenopathy, no carotid bruit, no JVD, supple, symmetrical, trachea midline and thyroid not enlarged, symmetric, no tenderness/mass/nodules  Lungs: clear to auscultation bilaterally  Heart: regular rate and rhythm, S1, S2 normal, no murmur, click, rub or gallop  Abdomen: soft, non-tender; bowel sounds normal; no masses,  no organomegaly.   Extremities: Left BKA, stump intact, no ulcerations; right TMA, skin intact, no ulcerations, PT pulse palpable; 2+ bilateral femoral that are palpable.      I have reviewed all the lab results in Erecord.    Assessment and Plan    Sherri Harrison is a 52 y.o. female with a history of end-stage renal disease secondary to DM and hypertension    Overall, she makes a good transplant candidate, however she is high risk from being a vasculopath and cardiac historyNSTEMI 2010 CABG x 2 2010 at [facility], Dr Terrilee Croak.   Regadenoson Nuclear Stress test 08/15/12 LVEF 80%, no evidence of ischemia or infarction   I recommend that we do CT imaging of the abdomen and pelvis to evaluate her vasculature to assess placement of kidney., If she already has had a CT scan recently at [facility] , then we can get the images in our system and have a look at it. She is concerned of getting more radiation from another CT  scan.    For Kidney transplant, preferably from a living  If not then from cadaver donor. I discussed at length the risk of complications from a KP TX and she would  prefer to get only a kidney transplant    we will complete her testing and present her to the committee and committee decision would be final in terms of her listing.     We have discussed the risks and benefits of transplantation, specifically the surgical risks which include but are not limited to infection, bleeding, injury to surrounding organs, graft non-function, graft dysfunction, and graft loss. We have also discussed the   potential morbidities, specifically cardiac morbidity, following surgery. We discussed the risks of immunosuppression. Specifically the short-term risks, including metabolic changes such as diabetes, electrolyte abnormalities, hypertension, risk of infections, and healing complications.  We also discussed the long-term risks, which mainly include infectious complications and risks of malignancy.  Specifically, I indicated that the immunosuppression itself would magnify the risk of developing any cancer.    We have discussed the fact that the wait for a cadaveric transplant is generally   5 to 6 years and occasionally longer. During this wait period, she could develop substantial comorbidities limiting their transplantability,     Ideally, if she had a live donor option, a live donor transplant would be the best option, which commonly allows immediate graft function and would allow this to be performed electively and avoid a 4 to 5-year wait period. I have indicated that we will discuss her case in committee. We will discuss the cardiac workup and other factors germane to her candidacy for transplantation. She would be notified after our meeting as to the decision to proceed with listing or if any further testing would be required. Alternatively, she would be notified if they were not listable and would be provided the reasons  for the decision. She acknowledges and is eagerly awaiting our response.

## 2012-11-02 ENCOUNTER — Ambulatory Visit: Payer: Self-pay

## 2012-11-21 ENCOUNTER — Other Ambulatory Visit: Payer: Self-pay | Admitting: Transplant Surgery Liver and Kidney

## 2012-11-21 DIAGNOSIS — Z01818 Encounter for other preprocedural examination: Secondary | ICD-10-CM

## 2012-11-23 ENCOUNTER — Other Ambulatory Visit: Payer: Self-pay

## 2012-11-23 ENCOUNTER — Ambulatory Visit: Payer: Self-pay | Admitting: Pulmonology

## 2012-11-27 ENCOUNTER — Ambulatory Visit: Payer: Self-pay

## 2012-12-06 ENCOUNTER — Telehealth: Payer: Self-pay | Admitting: Critical Care Medicine

## 2012-12-06 NOTE — Telephone Encounter (Signed)
Pt has transportation issues and needs an am appointment with any provider.  Appointment booked on 04/10 at 10 am with dr porter

## 2012-12-06 NOTE — Telephone Encounter (Signed)
The pt called stating she will not have transportation to come to her 4/3 afternoon appt w/ Dr. Awanda Mink.  She said she would like to have a morning appt that day, if possible.  Please call her at [phone]

## 2013-01-11 ENCOUNTER — Other Ambulatory Visit: Payer: Self-pay

## 2013-01-11 ENCOUNTER — Ambulatory Visit: Payer: Self-pay | Admitting: Internal Medicine

## 2013-01-11 ENCOUNTER — Ambulatory Visit: Payer: Self-pay | Admitting: Critical Care Medicine

## 2013-01-18 ENCOUNTER — Ambulatory Visit: Payer: Self-pay | Admitting: Pulmonology

## 2013-01-18 ENCOUNTER — Encounter: Payer: Self-pay | Admitting: Pulmonology

## 2013-01-18 ENCOUNTER — Ambulatory Visit: Payer: Self-pay

## 2013-01-18 VITALS — BP 187/91 | HR 84 | Resp 14 | Ht 61.0 in | Wt 146.0 lb

## 2013-01-18 NOTE — Progress Notes (Signed)
Drs. Nikki Dom, Willa Rough and Ladona Ridgel,  Is a brief summary my first visit with Sherri Harrison.  As you know, she is a 52 year old woman with type 1 diabetes and end-stage renal disease, having been on peritoneal dialysis and now hemodialysis for a number of years.  She's here for evaluation of potential kidney transplant.    She states she's had symptoms of dyspnea, usually worse the day prior to ultrafiltration.  She denies precipitants of dyspnea except for exertion.  There not made worse with recumbent position, or exposures to dust, fumes or other noxious stimuli.  She denies any seasonal allergies which bring on symptoms of dyspnea.  She was seen in the emergency room at [facility] for evaluation of cardiac etiology and this was negative.  In September she had a negative stress echo done in [place]. System.    She [facility] in January as [facility]  She was referred here for baseline lung function testing and evaluation.    She denies any cough, hemoptysis, fever, chills or sweats.  She denies any recent weight loss or weight gain.  She had negative PPD done recently he denies any tuberculosis exposures.    Her past history significant for type 1 diabetes, hypertension, end-stage renal disease, peripheral vascular disease status post left BKA, retinopathy.    Social history: She is currently separated and undergoing a divorce.  She was with 2 children.  She denies any history of smoking.      Her family history is significant for cancer and high blood pressure.      Review systems is as above is otherwise negative by a 13-point organ system review.  Please see the patient database intake form for full details      My exam is as follows:  pupils are equal round and reactive to light, anicteric, nares are patent, moist mucous membranes. Trachea is midline, no palpable thyromegally  Chest: lungs are clear to auscultation without any  wheeze or rhonchi, there is no consolidative changes or dullness to percussion.  Heart: Regular rate and rhythm with clear S1-S2 heart sounds, without any evidence of jugular venous distention.  Abdomen: Obese, bowel sounds are positive, they're normoactive, no evidence of organomegaly.  Extremities: Are warm and well perfused, there is no evidence of lower extremity edema. No clubbing or cyanosis.  Lymph: No cervical or supraclavicular Lymphadenopathy    Spirometry and oxygen diffusion capacity was performed.  Her FEV1 is 1.21 L or 49% predicted, her FVC is 1.56 L or 50% predicted, and a ratio is preserved at 78%.  Her oxygen diffusion capacity is moderately reduced at 54%.  Overall this is suggestive of severe restrictive lung disease and impairment of oxygen diffusion capacity.    I don't have any chest films available to me.  Her verbal report was that it was significant for pleural effusions.    Impression: This is a 52 year old woman with end-stage renal disease secondary to diabetes and hypertension here for an evaluation of pulmonary status and possible kidney transplant.  Her restrictive physiology in the presence of a normal exam suggests that this is related to pleural effusion as well as likely abdominal ascites contributing to restrictive physiology.  I doubt that there is underlying undiagnosed asthma condition in the absence of other precipitants.    I will obtain the films from [facility] for my personal evaluation.    I should move forward with  kidney transplant workup.    Have not set up a regular appointment to see me, but I will be happy to see her for any concerns of infectious consideration or dyspnea of new etiology.    Sincerely,  Juliann Mule, MD  Assistant Professor Medicine  Division Pulmonary and Critical Care  [address]  [address]  [place], [place] [address]  [email or web address]

## 2013-01-18 NOTE — Patient Instructions (Signed)
Your lungs show a restricitive process associated with fluid around the lungs and in abdomen. You have a normal lung exam and this goes against lung scarring disease as a cause.  I will get films of your lungs from [facility].   Walk a mile a day.    Call me with concerns.

## 2013-02-02 ENCOUNTER — Other Ambulatory Visit: Payer: Self-pay | Admitting: Transplant Surgery Liver and Kidney

## 2013-02-02 DIAGNOSIS — Z01818 Encounter for other preprocedural examination: Secondary | ICD-10-CM

## 2013-03-01 ENCOUNTER — Encounter: Payer: Self-pay | Admitting: Transplant Surgery Liver and Kidney

## 2013-03-01 ENCOUNTER — Ambulatory Visit: Payer: Self-pay | Admitting: Transplant Surgery Liver and Kidney

## 2013-03-01 ENCOUNTER — Ambulatory Visit
Admit: 2013-03-01 | Discharge: 2013-03-01 | Disposition: A | Discharge status: Home / Self Care | Payer: Self-pay | Source: Ambulatory Visit | Attending: Transplant Surgery Liver and Kidney | Admitting: Transplant Surgery Liver and Kidney

## 2013-03-01 VITALS — BP 182/79 | HR 88 | Temp 96.7°F | Ht 61.5 in | Wt 144.2 lb

## 2013-03-01 DIAGNOSIS — N185 Chronic kidney disease, stage 5: Secondary | ICD-10-CM

## 2013-03-01 DIAGNOSIS — E1121 Type 2 diabetes mellitus with diabetic nephropathy: Secondary | ICD-10-CM

## 2013-03-01 NOTE — H&P (Signed)
HPI:  Sherri Harrison is a 52 y.o. female in the Kidney Transplant Evaluation clinic for candidacy. She has been a Type I diabetic since age 51. Currently her blood glucoses are well controlled, she denies hypoglycemic unawareness or hypoglycemia. She began peritoneal dialysis in 2006 and transitioned recently to hemodialysis after an episode of peritonitis. She reports this was her one and only episode. She plans to remain on hemodialysis and is hoping for a living donor kidney transplant. She is also interested in pancreas transplantation. Family members have expressed interest in living kidney donation. Her additional significant past medical history is outlined below.       Past Medical History:   1. Diabetes, insulin dependent, since age 56. She reports recent HgbA1c of 6.8, but has been as low as 5.4. She denies hypoglycemic unawareness. Patient's family and she report she controls her diabetes very well by counting carbs and controlling her insulin. Her PCP manages this.   2. Hypertension, 30 year history. She is currently not on antihypertensives. BPs average 120/70s   3. Cardiac history:   NSTEMI 2010   CABG x 2 2010 at [facility], Dr Terrilee Croak.   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 She did undergo parathyroidectomy in 2009.\   5. Atrial fibrillation - she had one episode of rapid AF November 2013, she was started on Coumadin   OBGYN History: G4P2; she is post menopausal. Pap smear 08/07/12 negative for intraepithelial lesion or malignancy. Mammogram 08/11/12 Benign   TRAUMA HISTORY:   Metal in body: none   TRANSFUSION HISTORY   Reports she has received multiple blood transfusions, last transfusions were in December 2013   Past Surgical History    Procedure  Laterality  Date    .  Tonsillectomy   10/1979    .  Cesarean section, low transverse   1983 and 1988    .  Parathyroidectomy    2009      Dr Corine Shelter at [facility]    .  Other surgical history   2009      surgical debridement of sacral decubitis ulcer    .  Other surgical history   2006      insertion of PD catheter    .  Cardiac surgery   2010      CABG x2 - left internal mammary to LAD ; T graft to vein from IMA    .  Leg amputation below knee  Left  08/2008    .  Transmetatarsal amputation, right foot  Right  07/2008      due to calciphylaxis    Fem-Tib bypass right lower extremity 08/05/2008   Immunizations   Influenza: reports she was immunized this Fall   Pneumovax: 07/28/12   Hepatitis B:   PPD: 08/01/12 - zero induration 08/03/12   Tdap: 08/01/12   Allergies    Allergen  Reactions    .  Heparin  Other (See Comments)      HIT    .  Lisinopril  Other (See Comments)      Lightheadedness, weakness, hypotension    .  Sulfa Drugs     .  Ciprofloxacin  Swelling and Rash      Current Outpatient Prescriptions    Medication  Sig  Dispense  Refill    .  atorvastatin (LIPITOR) 20 MG tablet  Take 20 mg by  mouth daily (with dinner)      .  insulin NPH (HUMULIN N,NOVOLIN N) 100 UNIT/ML injection vial  Inject 6 Units into the skin 2 times daily (before meals)      .  B Complex-C-Zn-Folic Acid (NEPHPLEX RX PO)  Take by mouth      .  omeprazole (PRILOSEC) 20 MG capsule  Take 20 mg by mouth daily (before breakfast)      .  Insulin Regular Human (HUMULIN R IJ)  Inject as directed      .  Sevelamer Carbonate (RENVELA PO)  Take 800 mg by mouth Take with high phosphorus      .  WARFARIN SODIUM  By no specified route      .  promethazine (PHENERGAN) 12.5 MG tablet  Take 12.5 mg by mouth every 4-6 hours as needed        No current facility-administered medications for this visit.    Family History:   Mother: 74 living, Lyme disease, Meningioma   Father: deceased age 80 thyroid cancer, HTN   Siblings: Barbara 53, HTN, AV nodal tachycardia, BCC; Erik 48, scleroderma   Offspring: Nicole 30, healthy; Jen 25, healthy   Social History:   Citizenship: [place]   Education:  college  Employment: Disability, was an Programmer, multimedia   Residence: Lives with [place] and daughter   Spouse/Support system: Algernon Huxley, additional support include her sister Britta Mccreedy and two daughters   Hobbies: reading and listening to music   Religion: Mudlogger Service: No   Substance Abuse: Denies   ROS   CANCER history: negative   INFECTION history: negative   HEENT: recent dental visit 07/31/12, not in need of any dental work   PULM: she denies history of pulmonary disease, she denies SOB, cough   CARD: denies chest pain, palpitations   GI: unremarkable   GU: no UTI history, no voiding dysfunction or history of kidney stones. She voids a few ounces once per day   MUSCULO-SKELETAL: Denies joint pain or gout   PVD: no claudication symptoms; she has a history of lower extremity amputations, currently without ulcers   ENDO: she is a diabetic   HEME: no personal or family history of bleeding/clotting disorders; patient has never had a blood clot   MENTAL HEALTH: no history of depression or anxiety   EXERCISE TOLERANCE: able to walk one mile and climb one flight of stairs      Physical Exam      Objective:        Physical Exam:    General:  alerrt appearing woman; nad    Skin:  Skin is warm and dry without lesions and no icterus      HEENT: regular rate and rhythm without murmur; nl s1 and s2        Lung:  clear to auscultation and unlabored    CV:  regular rate and rhythm without murmur; nl s1 and s2        ABD:  Abdomen is soft and nontender without palpable hepatosplenomegaly      PV:  Left BKA;  Right transmet amp;  Good pulses in both groins.  Right foot dp diminished;  Left foot surgically absent    MS: Normal strength and tone in upper and lower extremities without joint swelling      Neuro: Cranial Nerves II-XII are intact;  Gait is normal      TEST RESULTS:  Lab Results   Component Value Date  WBC 8.8 10/19/2012    HGB 8.9* 10/19/2012    HCT 29* 10/19/2012    MCV 90 10/19/2012    PLT 388* 10/19/2012     Lab Results    Component Value Date    CREAT 6.34* 10/19/2012     Lab Results   Component Value Date    ALT 20 10/19/2012    AST 29 10/19/2012       Radiology impressions (last 3 days):  [place] Ankle Brachial Index Limited Abi Only    03/01/2013  IMPRESSION:   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   END OF IMPRESSION.       Assessment/Plan:    Tinley Chrest is a 52 y.o. female   1) Update pelvic ct - should probably have ct angio with the current doppler findings.  I think the calcification are some risk, but she should be fine  2) stress echo  3)review pft's    Patient is probably adequate for transplantation.  Cira Servant will likely be some increased risk due to vascular disease.              I first reviewed the options for kidney transplantation.  These include both the option of cadaver transplantation and live donor transplantation.  As far is cadaver transplant options, I reviewed the waiting time which is typically between 4-5 years and the variety of organs which are offered which includes both standard criteria donors and extended criteria donors.  I also reviewed the option of receiving a living donor transplant.  I discussed the merits of pursuing a live donor transplant vis--vis better long term graft survival and earlier timing of transplantation.  We also reviewed the option to participate in a paired exchange program if one were to have a potential living donor.    We next reviewed the surgical complications that might typically accompany a kidney transplant.  These include wound complications, fluid collections, bleeding, anastomotic leaks of the ureter, infection, and graft failure secondary to technical complications, most specifically, arterial and venous thromboses.    I then reviewed the risks of immunosuppression.  These generally included the increased susceptibility to viral and fungal infections, and the increased risk of a  post transplant malignancy, most likely a post transplant lymphoma or skin cancer but possibly other malignancies as well.  We also talked about the metabolic complications of immunosuppression which include interference with glucose control, the effective steroids on that metabolism, fluid retention, and bone health, and the neurologic and nephrotoxic properties of calcium her and inhibitors.  We also talked about the occasional use of antibody therapy for both induction and treatment of rejection as well as the possible use of increase steroids for the treatment of rejection.      Author: Loma Sender, MD  Date: 03/01/2013  Time: 11:47 AM

## 2013-03-13 ENCOUNTER — Encounter: Payer: Self-pay | Admitting: Transplant Surgery Liver and Kidney

## 2013-03-13 NOTE — Progress Notes (Signed)
Kidney Transplant Recipient Care Plan as of: 03/13/2013  at: 1:00 PM:     You must use the most recent care plan.  This plan may be revised several times before the actual transplant.  Please verify the date.     Recipient: Sherri Harrison   MRN: [id number]   DOB: [birth year]   Age: 52 y.o.   Primary Kidney Disease: diabetes   Referring Nephrologist:   Dr Jonny Ruiz Hix   Dialysis Unit:   [facility])  [phone]     Clinical Information     Recipient Donor   ABO:  O RH NEG    PRA: 28%    CMV: Negative    EBV: Positive    Donor Info       Risk Factors:    Risk Description Plan   Medication Allergies:   Allergies   Allergen Reactions   . 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 Lisinopril Other (See Comments)     Lightheadedness, weakness, hypotension     . Sulfa Drugs    . Ciprofloxacin Swelling and Rash       Cardiac / Hemodynamic   NSTEMI and CABG 2010.    Regadenoson Nuclear Stress test at [facility] on 08/15/12 LVEF 80%, no evidence of ischemia or infarction    Atrial fibrillation   Yearly stress testing while on waiting list.    Peri-op beta blocker (patient currently not one)   Pulmonary No documented lung disease however PFTs on 4/101/4 revealed FEV1 1.21, evaluated by Dr Juliann Mule and cleared for Kidney transplant. Yearly PFTs while on waiting list.     Anticoagulation A fib, on coumadin Hold Coumadin 5 days prior to transplant and resume when able post op.    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   High risk candidate, Dr Kellie Moor has reviewed her scan, placement of kidney on right iliac, will need yearly CT to assess vessels.   Diabetes   Type I diabetes Sliding Scale insulin   Wound Healing   BMI 26.8     Infectious Disease     Psychiatric   No issues identified    Other               Operative Plan:    Surgeon:      Vascular Access:      Anaesthesia Issues:     Donor Organ Issues    Placement of Organ Right per Dr Kellie Moor   Surgical Concerns High risk candidate due to vascular disease   Post-Operative Recovery Plan:  (ICU / Stepdown / Floor) May need stepdown vs ICU as she is high risk due to cardiac and pulmonary disease, will need closer monitoring     Immunosuppression Plan:    Induction Plan: Dependent on donor type.  Consider steroid avoidance protocol due to Type I diabetes   Maintenance Immunsuppression: Tacrolimus, MMF, prednisone.

## 2013-03-15 ENCOUNTER — Encounter: Payer: Self-pay | Admitting: Transplant Surgery Liver and Kidney

## 2013-04-10 ENCOUNTER — Ambulatory Visit
Admit: 2013-04-10 | Discharge: 2013-04-10 | Disposition: A | Discharge status: Home / Self Care | Payer: Self-pay | Source: Ambulatory Visit | Admitting: Transplant Surgery Liver and Kidney

## 2013-05-17 ENCOUNTER — Ambulatory Visit
Admit: 2013-05-17 | Discharge: 2013-05-17 | Disposition: A | Discharge status: Home / Self Care | Payer: Self-pay | Source: Ambulatory Visit | Admitting: Transplant Surgery Liver and Kidney

## 2013-05-17 ENCOUNTER — Ambulatory Visit
Admit: 2013-05-17 | Discharge: 2013-05-17 | Disposition: A | Discharge status: Home / Self Care | Payer: Self-pay | Source: Ambulatory Visit | Admitting: Nephrology

## 2013-06-14 ENCOUNTER — Ambulatory Visit
Admit: 2013-06-14 | Discharge: 2013-06-14 | Disposition: A | Discharge status: Home / Self Care | Payer: Self-pay | Source: Ambulatory Visit | Admitting: Transplant Surgery Liver and Kidney

## 2013-06-20 ENCOUNTER — Ambulatory Visit: Payer: Self-pay | Admitting: Ophthalmology

## 2013-06-29 ENCOUNTER — Ambulatory Visit: Payer: Self-pay | Admitting: Ophthalmology

## 2013-07-05 ENCOUNTER — Telehealth: Payer: Self-pay | Admitting: Pulmonology

## 2013-07-05 NOTE — Telephone Encounter (Signed)
Faxed over requested info per patients request    Blondell Reveal RN [facility]

## 2013-07-05 NOTE — Telephone Encounter (Signed)
Patient called requesting that the office notes from her visit with Dr Hale Bogus on 4.10.14 be sent to "[employer]" where she gets her prosthetics. The insurance company is requesting them for a prior authorization to get a new prosthetic for the patient. Please fax to [phone] "Attention Dois Davenport", Patient can be reached at [phone]

## 2013-07-12 ENCOUNTER — Ambulatory Visit
Admit: 2013-07-12 | Discharge: 2013-07-12 | Disposition: A | Discharge status: Home / Self Care | Payer: Self-pay | Source: Ambulatory Visit | Admitting: Transplant Surgery Liver and Kidney

## 2013-07-20 ENCOUNTER — Other Ambulatory Visit: Payer: Self-pay

## 2013-07-20 DIAGNOSIS — Z992 Dependence on renal dialysis: Secondary | ICD-10-CM

## 2013-07-20 DIAGNOSIS — N186 End stage renal disease: Secondary | ICD-10-CM

## 2013-08-16 ENCOUNTER — Ambulatory Visit
Admit: 2013-08-16 | Discharge: 2013-08-16 | Disposition: A | Discharge status: Home / Self Care | Payer: Self-pay | Source: Ambulatory Visit | Admitting: Transplant Surgery Liver and Kidney

## 2013-08-16 NOTE — Progress Notes (Signed)
Kidney Transplant Recipient Risk Screen Care Plan as of: 08/16/2013  at: 12:54 PM:     You must use the most recent care plan.  This plan may be revised several times before the actual transplant.  Please verify the date.   Recipient: Sherri Harrison   MRN:    [id number]   DOB: 1961-02-22   Age: 52 y.o.   Last four SS# [id number]   Primary Kidney Disease: Type I DM since age 54   Prior Transplant: None   Referring Nephrologist/contact number: Dr. Nona Dell Hix [phone]   Dialysis days/times, Unit/contact number: [facility] [phone] TTS [phone]   Dialysis Start Date: 09/13/2005   PCP/contact number: Dr. Joaquin Music [phone]     Clinical Information Recipient Donor   ABO:  O RH NEG    HLA: A 2,3 B 62 DR 4    PRA: 26%    CMV: NEG    EBV: POS    Hep C: NEG    Hep BsAB+: POS    Hep BcoreAB+: NEG           Clinical Information Recipient   PMH: Type I DM since age 35  HTN   2010 NSTEMI  2010 CABG x 2 at [facility] with Dr. 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 Lisinopril Other (See Comments)     Lightheadedness, weakness, hypotension     . Sulfa Drugs    . Ciprofloxacin Swelling and Rash       Cardiac / Hemodynamic  Stress echo:  Cardiac cath: 08/15/2013  Nuclear stress echo done at [facility] with Dr.  Sharene Skeans:  Results:  Stress: No chest pain, ECG changes, or hemydynamic changes with medicine.  Images: Image quality was satisfactory. With stress, there is a minimal defect of the distal anterior wall and apex. At rest, the defect fills in.  Left Ventricular Wall Motion/EF: Normal Wall Motion. LVEF is 83%.    Conclusions:  1. Minimal ischemia of the distal anterior wall and apex.  2. Normal wall motion. LVEF is 83%.14 Yearly stress testing while on waiting list.   Peri-op beta blocker (patient currently not one)      Pulmonary  PFT: No documented lung disease however PFTs on 01/18/13 revealed FEV1 1.21, evaluated by Dr Juliann Mule and cleared for Kidney transplant.   Yearly PFTs while on waiting list.     Anticoagulation A fib, on coumadin    Hold Coumadin 5 days prior to transplant and resume when able post op.   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    Carotid 03/01/13  Impression:  1. No significant internal carotid artery stenosis bilaterally ( 1-15%) with shallow plaque noted  2. Antegrade vertebral artery flow bilaterally  3. There are no previous exams for comparison       High risk candidate, Dr Kellie Moor has reviewed her scan, placement of kidney on right iliac, will need yearly CT to assess vessels.      Diabetes Type I DM Sliding Scale insulin   Infectious Disease History No concerns    Cancer History None    Other 31-Oct-2007 15:21 -- Colonoscopy  COLONOSCOPIC IMPRESSION:  Examination is done up to the cecum showing normal colonic  mucosa without diverticulosis, inflammation, ulcers, arteriovenous malformation or polyps to cecum. The spasm is noted only.      Operative  Plan:  Surgeon: 03/01/2013:  Loma Sender, MD  Sherri Harrison is a 52 y.o.female. Patient is probably adequate for transplantation. There will likely be some increased risk due to vascular disease.   Vascular access:    Anesthesia Issues:    Donor Organ Issues:    Placement of Organ: Right per Dr. Kellie Moor   Surgical Concerns: High Risk Candidate due to vascular disease   Post-Operative Recovery Plan:  (ICU / Stepdown / Floor) May need stepdown vs ICU as she is high risk due to cardiac and pulmonary disease, will need closer monitoring      Immunosuppression Plan:  Induction Plan:   Dependent on donor type. Consider steroid avoidance protocol due to Type I diabetes    Maintenance Immunosuppression Tacrolimus, MMF, prednisone.        UNOS Consents if applicable:  Deceased donor KDPI >85% organ acceptance Consented for ECD kidney   Recip ABO:B Q90 day Anti A-2 and Anti A-2B titer levels N/A     Insurance:  Primary Medicare   Secondary [employer]     Annual Update:  Language other than English: None   Date listed: March 16, 2013   Organ listed for: Kidney   Waitlist update done: 07/20/2013   HT, WT, BMI: Ht 61"  145 lbs BMI 27.29   Last blood transfusion: Multiple 09/15/2012   Contact numbers: [phone] (home)   Sherri Harrison, daughter, [phone], [phone] ext [phone] work  Sherri Harrison, daughter, [phone]   Sherri Harrison from [facility]Town: [address]  [place] [address]  6 miles   Reminders: Needs PFT yearly - scheduled 10/24/2013  Needs Cardiac stress echo yearly - due 08/2014  Needs CT Abd/Pelvis without contrast yearly - due 03/2014 prior to coming in for a 1 year WLU - due 03/2014.  Pt has consented to [facility] access for [facility].   Annual Update Note: Date: 07/20/2013 KIDNEY WAIT LIST UPDATE: Spoke with Jen[place]a and reviewed clinical information. Pt. is having R cataract surgery next Tuesday 07/24/13. Had updated pap smear done in May. Results not available in [facility]. Will request GYN fax results. Jakiyah will schedule  mammogram in November. Due for cardiac stress echocardiogram in November(will schedule). Has a follow up appt. with Dr. Corinna Gab 08/24/13. Stable on HD. Alb 4.0. 06/14/2013 Hep B surf AB +. Due for PFTs in 10/2013 (will schedule). Needs an updated CT abd/pelvis  without contrast June 2015 prior to update appt. with Dr. Kellie Moor.

## 2013-08-17 ENCOUNTER — Ambulatory Visit
Admit: 2013-08-17 | Discharge: 2013-08-17 | Disposition: A | Discharge status: Home / Self Care | Payer: Self-pay | Source: Ambulatory Visit | Admitting: Nephrology

## 2014-06-03 ENCOUNTER — Emergency Department (HOSPITAL_COMMUNITY)
Admission: EM | Admit: 2014-06-03 | Discharge: 2014-06-03 | Disposition: A | Discharge status: Home / Self Care | Payer: BC Managed Care – PPO | Attending: Emergency Medicine | Admitting: Emergency Medicine

## 2014-06-03 ENCOUNTER — Emergency Department (HOSPITAL_COMMUNITY): Payer: BC Managed Care – PPO

## 2014-06-03 ENCOUNTER — Encounter (HOSPITAL_COMMUNITY): Payer: Self-pay | Admitting: Emergency Medicine

## 2014-06-03 DIAGNOSIS — R202 Paresthesia of skin: Secondary | ICD-10-CM

## 2014-06-03 DIAGNOSIS — Z79899 Other long term (current) drug therapy: Secondary | ICD-10-CM | POA: Insufficient documentation

## 2014-06-03 DIAGNOSIS — F17200 Nicotine dependence, unspecified, uncomplicated: Secondary | ICD-10-CM | POA: Insufficient documentation

## 2014-06-03 DIAGNOSIS — R519 Headache, unspecified: Secondary | ICD-10-CM

## 2014-06-03 DIAGNOSIS — R51 Headache: Secondary | ICD-10-CM | POA: Diagnosis present

## 2014-06-03 DIAGNOSIS — R209 Unspecified disturbances of skin sensation: Secondary | ICD-10-CM | POA: Diagnosis not present

## 2014-06-03 LAB — CBC WITH DIFFERENTIAL/PLATELET
BASOS PCT: 1 % (ref 0–1)
Basophils Absolute: 0.1 10*3/uL (ref 0.0–0.1)
EOS ABS: 0.1 10*3/uL (ref 0.0–0.7)
Eosinophils Relative: 1 % (ref 0–5)
HCT: 39.4 % (ref 36.0–46.0)
HEMOGLOBIN: 12.9 g/dL (ref 12.0–15.0)
LYMPHS ABS: 4.3 10*3/uL — AB (ref 0.7–4.0)
Lymphocytes Relative: 51 % — ABNORMAL HIGH (ref 12–46)
MCH: 28.9 pg (ref 26.0–34.0)
MCHC: 32.7 g/dL (ref 30.0–36.0)
MCV: 88.1 fL (ref 78.0–100.0)
MONOS PCT: 6 % (ref 3–12)
Monocytes Absolute: 0.5 10*3/uL (ref 0.1–1.0)
NEUTROS ABS: 3.4 10*3/uL (ref 1.7–7.7)
NEUTROS PCT: 41 % — AB (ref 43–77)
PLATELETS: 334 10*3/uL (ref 150–400)
RBC: 4.47 MIL/uL (ref 3.87–5.11)
RDW: 14 % (ref 11.5–15.5)
WBC: 8.2 10*3/uL (ref 4.0–10.5)

## 2014-06-03 LAB — BASIC METABOLIC PANEL
Anion gap: 13 (ref 5–15)
BUN: 15 mg/dL (ref 6–23)
CHLORIDE: 102 meq/L (ref 96–112)
CO2: 23 mEq/L (ref 19–32)
Calcium: 9.7 mg/dL (ref 8.4–10.5)
Creatinine, Ser: 0.81 mg/dL (ref 0.50–1.10)
GFR, EST NON AFRICAN AMERICAN: 81 mL/min — AB (ref >90)
GLUCOSE: 84 mg/dL (ref 70–99)
POTASSIUM: 4.8 meq/L (ref 3.7–5.3)
SODIUM: 138 meq/L (ref 137–147)

## 2014-06-03 MED ORDER — BUTALBITAL-APAP-CAFFEINE 50-325-40 MG PO TABS: 1.0000 | ORAL_TABLET | Freq: Four times a day (QID) | ORAL | Status: AC | PRN

## 2014-06-03 MED ORDER — METOCLOPRAMIDE HCL 5 MG/ML IJ SOLN
10.0000 mg | Freq: Once | INTRAMUSCULAR | Status: AC
  Administered 2014-06-03: 10 mg via INTRAVENOUS
  Filled 2014-06-03: qty 2

## 2014-06-03 MED ORDER — DIPHENHYDRAMINE HCL 50 MG/ML IJ SOLN
25.0000 mg | Freq: Once | INTRAMUSCULAR | Status: AC
Start: 2014-06-03 — End: 2014-06-03
  Administered 2014-06-03: 25 mg via INTRAVENOUS
  Filled 2014-06-03: qty 1

## 2014-06-03 MED ORDER — METHYLPREDNISOLONE SODIUM SUCC 125 MG IJ SOLR
125.0000 mg | Freq: Once | INTRAMUSCULAR | Status: AC
  Administered 2014-06-03: 125 mg via INTRAVENOUS
  Filled 2014-06-03: qty 2

## 2014-06-03 MED ORDER — ASPIRIN 81 MG PO CHEW
324.0000 mg | CHEWABLE_TABLET | Freq: Once | ORAL | Status: AC
  Administered 2014-06-03: 324 mg via ORAL
  Filled 2014-06-03: qty 4

## 2014-06-03 MED ORDER — SODIUM CHLORIDE 0.9 % IV BOLUS (SEPSIS)
1000.0000 mL | Freq: Once | INTRAVENOUS | Status: AC
  Administered 2014-06-03: 1000 mL via INTRAVENOUS

## 2014-06-03 NOTE — Discharge Instructions (Signed)
Do not hesitate to return to the emergency room for any new, worsening or concerning symptoms.  Please obtain primary care using resource guide below. But the minute you were seen in the emergency room and that they will need to obtain records for further outpatient management.    Headaches, Frequently Asked Questions MIGRAINE HEADACHES Q: What is migraine? What causes it? How can I treat it? A: Generally, migraine headaches begin as a dull ache. Then they develop into a constant, throbbing, and pulsating pain. You may experience pain at the temples. You may experience pain at the front or back of one or both sides of the head. The pain is usually accompanied by a combination of:  Nausea.  Vomiting.  Sensitivity to light and noise. Some people (about 15%) experience an aura (see below) before an attack. The cause of migraine is believed to be chemical reactions in the brain. Treatment for migraine may include over-the-counter or prescription medications. It may also include self-help techniques. These include relaxation training and biofeedback.  Q: What is an aura? A: About 15% of people with migraine get an "aura". This is a sign of neurological symptoms that occur before a migraine headache. You may see wavy or jagged lines, dots, or flashing lights. You might experience tunnel vision or blind spots in one or both eyes. The aura can include visual or auditory hallucinations (something imagined). It may include disruptions in smell (such as strange odors), taste or touch. Other symptoms include:  Numbness.  A "pins and needles" sensation.  Difficulty in recalling or speaking the correct word. These neurological events may last as long as 60 minutes. These symptoms will fade as the headache begins. Q: What is a trigger? A: Certain physical or environmental factors can lead to or "trigger" a migraine. These include:  Foods.  Hormonal changes.  Weather.  Stress. It is important to  remember that triggers are different for everyone. To help prevent migraine attacks, you need to figure out which triggers affect you. Keep a headache diary. This is a good way to track triggers. The diary will help you talk to your healthcare professional about your condition. Q: Does weather affect migraines? A: Bright sunshine, hot, humid conditions, and drastic changes in barometric pressure may lead to, or "trigger," a migraine attack in some people. But studies have shown that weather does not act as a trigger for everyone with migraines. Q: What is the link between migraine and hormones? A: Hormones start and regulate many of your body's functions. Hormones keep your body in balance within a constantly changing environment. The levels of hormones in your body are unbalanced at times. Examples are during menstruation, pregnancy, or menopause. That can lead to a migraine attack. In fact, about three quarters of all women with migraine report that their attacks are related to the menstrual cycle.  Q: Is there an increased risk of stroke for migraine sufferers? A: The likelihood of a migraine attack causing a stroke is very remote. That is not to say that migraine sufferers cannot have a stroke associated with their migraines. In persons under age 46, the most common associated factor for stroke is migraine headache. But over the course of a person's normal life span, the occurrence of migraine headache may actually be associated with a reduced risk of dying from cerebrovascular disease due to stroke.  Q: What are acute medications for migraine? A: Acute medications are used to treat the pain of the headache after it has started.  Examples over-the-counter medications, NSAIDs, ergots, and triptans.  Q: What are the triptans? A: Triptans are the newest class of abortive medications. They are specifically targeted to treat migraine. Triptans are vasoconstrictors. They moderate some chemical reactions in  the brain. The triptans work on receptors in your brain. Triptans help to restore the balance of a neurotransmitter called serotonin. Fluctuations in levels of serotonin are thought to be a main cause of migraine.  Q: Are over-the-counter medications for migraine effective? A: Over-the-counter, or "OTC," medications may be effective in relieving mild to moderate pain and associated symptoms of migraine. But you should see your caregiver before beginning any treatment regimen for migraine.  Q: What are preventive medications for migraine? A: Preventive medications for migraine are sometimes referred to as "prophylactic" treatments. They are used to reduce the frequency, severity, and length of migraine attacks. Examples of preventive medications include antiepileptic medications, antidepressants, beta-blockers, calcium channel blockers, and NSAIDs (nonsteroidal anti-inflammatory drugs). Q: Why are anticonvulsants used to treat migraine? A: During the past few years, there has been an increased interest in antiepileptic drugs for the prevention of migraine. They are sometimes referred to as "anticonvulsants". Both epilepsy and migraine may be caused by similar reactions in the brain.  Q: Why are antidepressants used to treat migraine? A: Antidepressants are typically used to treat people with depression. They may reduce migraine frequency by regulating chemical levels, such as serotonin, in the brain.  Q: What alternative therapies are used to treat migraine? A: The term "alternative therapies" is often used to describe treatments considered outside the scope of conventional Western medicine. Examples of alternative therapy include acupuncture, acupressure, and yoga. Another common alternative treatment is herbal therapy. Some herbs are believed to relieve headache pain. Always discuss alternative therapies with your caregiver before proceeding. Some herbal products contain arsenic and other toxins. TENSION  HEADACHES Q: What is a tension-type headache? What causes it? How can I treat it? A: Tension-type headaches occur randomly. They are often the result of temporary stress, anxiety, fatigue, or anger. Symptoms include soreness in your temples, a tightening band-like sensation around your head (a "vice-like" ache). Symptoms can also include a pulling feeling, pressure sensations, and contracting head and neck muscles. The headache begins in your forehead, temples, or the back of your head and neck. Treatment for tension-type headache may include over-the-counter or prescription medications. Treatment may also include self-help techniques such as relaxation training and biofeedback. CLUSTER HEADACHES Q: What is a cluster headache? What causes it? How can I treat it? A: Cluster headache gets its name because the attacks come in groups. The pain arrives with little, if any, warning. It is usually on one side of the head. A tearing or bloodshot eye and a runny nose on the same side of the headache may also accompany the pain. Cluster headaches are believed to be caused by chemical reactions in the brain. They have been described as the most severe and intense of any headache type. Treatment for cluster headache includes prescription medication and oxygen. SINUS HEADACHES Q: What is a sinus headache? What causes it? How can I treat it? A: When a cavity in the bones of the face and skull (a sinus) becomes inflamed, the inflammation will cause localized pain. This condition is usually the result of an allergic reaction, a tumor, or an infection. If your headache is caused by a sinus blockage, such as an infection, you will probably have a fever. An x-ray will confirm a sinus blockage. Your  caregiver's treatment might include antibiotics for the infection, as well as antihistamines or decongestants.  REBOUND HEADACHES Q: What is a rebound headache? What causes it? How can I treat it? A: A pattern of taking acute  headache medications too often can lead to a condition known as "rebound headache." A pattern of taking too much headache medication includes taking it more than 2 days per week or in excessive amounts. That means more than the label or a caregiver advises. With rebound headaches, your medications not only stop relieving pain, they actually begin to cause headaches. Doctors treat rebound headache by tapering the medication that is being overused. Sometimes your caregiver will gradually substitute a different type of treatment or medication. Stopping may be a challenge. Regularly overusing a medication increases the potential for serious side effects. Consult a caregiver if you regularly use headache medications more than 2 days per week or more than the label advises. ADDITIONAL QUESTIONS AND ANSWERS Q: What is biofeedback? A: Biofeedback is a self-help treatment. Biofeedback uses special equipment to monitor your body's involuntary physical responses. Biofeedback monitors:  Breathing.  Pulse.  Heart rate.  Temperature.  Muscle tension.  Brain activity. Biofeedback helps you refine and perfect your relaxation exercises. You learn to control the physical responses that are related to stress. Once the technique has been mastered, you do not need the equipment any more. Q: Are headaches hereditary? A: Four out of five (80%) of people that suffer report a family history of migraine. Scientists are not sure if this is genetic or a family predisposition. Despite the uncertainty, a child has a 50% chance of having migraine if one parent suffers. The child has a 75% chance if both parents suffer.  Q: Can children get headaches? A: By the time they reach high school, most young people have experienced some type of headache. Many safe and effective approaches or medications can prevent a headache from occurring or stop it after it has begun.  Q: What type of doctor should I see to diagnose and treat my  headache? A: Start with your primary caregiver. Discuss his or her experience and approach to headaches. Discuss methods of classification, diagnosis, and treatment. Your caregiver may decide to recommend you to a headache specialist, depending upon your symptoms or other physical conditions. Having diabetes, allergies, etc., may require a more comprehensive and inclusive approach to your headache. The National Headache Foundation will provide, upon request, a list of [facility] physician members in your state. Document Released: 12/18/2003 Document Revised: 12/20/2011 Document Reviewed: 05/27/2008 [facility] Patient Information 2015 [place], [place]. This information is not intended to replace advice given to you by your health care provider. Make sure you discuss any questions you have with your health care provider.   Emergency Department Resource Guide 1) Find a Doctor and Pay Out of Pocket Although you won't have to find out who is covered by your insurance plan, it is a good idea to ask around and get recommendations. You will then need to call the office and see if the doctor you have chosen will accept you as a new patient and what types of options they offer for patients who are self-pay. Some doctors offer discounts or will set up payment plans for their patients who do not have insurance, but you will need to ask so you aren't surprised when you get to your appointment.  2) Contact Your Local Health Department Not all health departments have doctors that can see patients for sick visits,  but many do, so it is worth a call to see if yours does. If you don't know where your local health department is, you can check in your phone book. The CDC also has a tool to help you locate your state's health department, and many state websites also have listings of all of their local health departments.  3) Find a Walk-in Clinic If your illness is not likely to be very severe or complicated, you may want to try a  walk in clinic. These are popping up all over the country in pharmacies, drugstores, and shopping centers. They're usually staffed by nurse practitioners or physician assistants that have been trained to treat common illnesses and complaints. They're usually fairly quick and inexpensive. However, if you have serious medical issues or chronic medical problems, these are probably not your best option.  No Primary Care Doctor: - Call Health Connect at  [phone] - they can help you locate a primary care doctor that  accepts your insurance, provides certain services, etc. - Physician Referral Service- [phone]  Chronic Pain Problems: Organization         Address  Phone   Notes  [facility]  [phone] Patients need to be referred by their primary care doctor.   Medication Assistance: Organization         Address  Phone   Notes  [facility] Medication [facility] [address] [place], [place] [address] [phone] --Must be a resident of [facility] -- Must have NO insurance coverage whatsoever (no Medicaid/ Medicare, etc.) -- The pt. MUST have a primary care doctor that directs their care regularly and follows them in the community   [facility]  [phone]   [employer]  [phone]    Agencies that provide inexpensive medical care: Organization         Address  Phone   Notes  [facility]  [phone]   [facility]    [phone]   [facility] [address] [place], [place] [address] [phone]   [facility] [address] [place] [place] [phone]   Planned Parenthood    [phone]   [facility]    [phone]   Community Health and [facility]  [address], [place] Phone:  [phone], Fax:  [phone] Hours of Operation:  9 am - 6 pm, M-F.  Also accepts Medicaid/Medicare and self-pay.  [facility]  [address], [place] Phone: [phone], Fax: [phone]. Hours of Operation:  8:30 am - 5:30 pm, M-F.  Also accepts Medicaid and self-pay.  [facility] [place] [address], [place] Phone: [phone]   [facility] [address] Natasha Bence [place], [place] [phone], Ext. [phone] Mondays & Thursdays: 7-9 AM.  First 15 patients are seen on a first come, first serve basis.    Medicaid-accepting [facility] Providers:  Organization         Address  Phone   Notes  [facility] [address], [place] [phone] Also accepts self-pay patients.  [facility] [address] Laurell Josephs [place], [place]  [phone]   [facility] [address], [place] [phone]   [facility] [address] [place] [phone]  Renaye Rakers [address], [place]   [phone] Only accepts [place] patients after they have their name applied to their card.   Self-Pay (no insurance) in [facility]:  Organization         Address  Phone   Notes  Sickle Cell Patients, [facility] [address] [place], [place] [phone]   [facility] [address] [place], [place] [phone]   [facility] [place]  [address], [place] [phone]   [facility]/Dr. Osei-Bonsu  [address] [place] or [address], [place] [phone] Phone number for both [place] and [place] locations is the same.  [facility] [address], [place] [phone]   [facility] [address], [place] or [address] [phone]   [facility] [address] [place] [phone], phone; [phone], fax Sees  patients 1st and 3rd Saturday of every month.  Must not qualify for public or private insurance (i.e. Medicaid, Medicare, [place] Health Choice, Veterans' Benefits)  Household income should be no more than 200% of the poverty level The clinic cannot treat you if you are pregnant or think you are pregnant  Sexually transmitted diseases are not treated at the clinic.    Dental Care: Organization         Address  Phone  Notes  [facility] Department of [facility] [address] [place], [place] [phone] Accepts children up to age 18 who are enrolled in [place] or [place] Health Choice; pregnant women with a Medicaid card; and children who have applied for Medicaid or [place] Health Choice, but were declined, whose parents can pay a reduced fee at time of service.  [facility] Department of [facility]  [address], [place] [phone] Accepts children up to age 43 who are enrolled in [place] or [place] Health Choice; pregnant women with a Medicaid card; and children who have applied for Medicaid or [place] Health Choice, but were declined, whose parents can pay a reduced fee at time of service.  [facility]  [address] [place], [place] [phone] Patients are seen by appointment only. Walk-ins are not accepted. [facility] will see patients 25 years of age and older. Monday - Tuesday (8am-5pm) Most Wednesdays (8:30-5pm) $30 per visit, cash only  [facility]  [address], [facility] [phone] Patients are seen by appointment only. Walk-ins are not accepted. [facility] will see patients 48 years of age and older. One Wednesday Evening (Monthly: Volunteer Based).  $30 per visit, cash only  [employer]  [phone] for adults; Children under age 76, call Graduate Pediatric Dentistry at [phone]. Children aged 51-14, please call [phone] to request a  pediatric application.  Dental services are provided in all areas of dental care including fillings, crowns and bridges, complete and partial dentures, implants, gum treatment, root canals, and extractions. Preventive care is also provided. Treatment is provided to both adults and children. Patients are selected via a lottery and there is often a waiting list.   [facility] [address] [place]  [phone] [email or web address]   [facility] [address] [place], [place] [phone], Ext.  123 Second and Fourth Thursday of each month, opens at 6:30 AM; Clinic ends at 9 AM.  Patients are seen on a first-come first-served basis, and a limited number are seen during each clinic.   [facility]  [address] [place], [place] [phone]   Eligibility Requirements You must have lived in [place], [place], or [place] counties for at least the last three months.   You cannot be eligible for state or federal sponsored [place], including [employer], [place], or [employer].   You generally cannot be eligible for healthcare insurance through your employer.    How to apply: Eligibility screenings are held every Tuesday and Wednesday afternoon from 1:00 pm until 4:00 pm. You do not need an appointment for the interview!  [facility] [address] [place], [place] [phone]   [facility] Health Department  [phone]   [facility] Health Department  [phone]   [facility] Health Department  [phone]    Behavioral Health Resources in the Community: Intensive Outpatient Programs Organization         Address  Phone  Notes  [facility] [address], [place], [place] [phone]   [facility] Outpatient [address], [place], [place] [phone]   [facility] [address] [place], [place]  [phone]   [facility]  Mental Health [address],  [place], [place] [phone] or [phone]   Substance Abuse Resources Organization         Address  Phone  Notes  [facility]  [phone]   [facility]  [phone]   [place]  [phone]   Floydene Flock  [phone]   Residential & Outpatient Substance Abuse Program  [phone]   Psychological Services Organization         Address  Phone  Notes  [facility] Health  [phone]   [facility]  [phone]   [facility] [address], [place] [phone] or [phone]    Mobile Crisis Teams Organization         Address  Phone  Notes  [facility], Mobile Crisis Care Unit  [phone]   [facility]  [address] [place], [place] [phone]   Doristine Locks [address] [place] [phone]    Self-Help/Support Groups Organization         Address  Phone             Notes  [facility] [place] - variety of support groups  336- I7437963 Call for more information  Narcotics Anonymous (NA), [facility] [address], [employer]   2 meetings at this location   Statistician         Address  Phone  Notes  [facility] [address],    [place]  [phone]   [facility]  [address] [place] [address], [place], [place] [phone]   [facility] [address] [place], [place] [phone] Admissions: 8am-3pm M-F  [facility] [address],    [place], [place] [phone]   [facility] [address] Starling Manns [place], [place] [phone]   [facility] [address]  [place], [place] [phone]   [facility] - Intensive Outpatient [address] Laurell Josephs [address], [place], [place] [phone]   [facility]  [address],    [place], [place] [phone] or [phone]   [facility] ([facility]) [address], [place], [place] [phone] Accepts Medicaid  [facility] [address]  [place] [phone] Substance Abuse/Addiction Treatment   [facility] Organization         Address  Phone  Notes  [facility]  [phone]   Angie Fava, PhD [address] Ervin Knack [place], [place]   [phone] or [phone]   [facility]   [address] [place], [place] [phone]   [facility] [address] [place], [place] [phone] Insurance/Medicaid/sponsorship through [facility] and Families [address]                                    [place], [place] [phone] Therapy/tele-psych/case  [facility] [address], [place] [phone]    Dr. Lolly Mustache  [phone]   [facility]  [employer] [facility] Dept. 1) [address], [place] 2) [address] [place] 3)  [address], [place] [phone]  [phone]   [facility] Child Abuse Hotline [phone] or [phone] (After Hours)

## 2014-06-03 NOTE — ED Provider Notes (Signed)
CSN: [id number]     Arrival date & time 06/03/14  1315 History   First MD Initiated Contact with Patient 06/03/14 1511     Chief Complaint  Patient presents with  . Headache     (Consider location/radiation/quality/duration/timing/severity/associated sxs/prior Treatment) HPI  Savannah Lopez is a 53 y.o. female complaining of right periorbital headache rated at 6/10, insulin onset 2 days ago associated with decreased sensation and pins and needles paresthesia in the left periorbital area and also to the bilateral hands (she states this is chronic from carpal tunnel). Patient denies fever, chills, rash, cervicalgia, thunderclap onset, exacerbation with Valsalva, exacerbation the morning, change in vision, dysarthria, ataxia, chest pain, shortness of breath, change in bowel or bladder habits. Patient states she had a sinus infection and took a Z-Pak last week that resolved. There was no headache associated with the sinus infection it was just rhinorrhea and anterior cervical lymphadenopathy. She's been changing her diet to soups, Gatorade and smoothies because she thought these issues were secondary to electrolyte abnormality. She's not have a primary care physician. She is taking weight loss medications of phentermine and biotin which she's been on for 1.5 years. She is in her first primary care appointment at [facility] in September.  History reviewed. No pertinent past medical history. History reviewed. No pertinent past surgical history. No family history on file. History  Substance Use Topics  . Smoking status: Current Every Day Smoker -- 0.25 packs/day    Types: Cigarettes  . Smokeless tobacco: Not on file  . Alcohol Use: No   OB History   Grav Para Term Preterm Abortions TAB SAB Ect Mult Living                 Review of Systems  10 systems reviewed and found to be negative, except as noted in the HPI.   Allergies  Bee venom  Home Medications   Prior to Admission medications     Medication Sig Start Date End Date Taking? Authorizing Provider  Biotin 10 MG TABS Take 10 mg by mouth daily.   Yes Historical Provider, MD  Liraglutide (VICTOZA) 18 MG/3ML SOPN Inject 3.6 mg into the skin every other day.    Yes Historical Provider, MD  Multiple Vitamins-Minerals (THERAGRAN-M PO) Take 1 capsule by mouth daily.   Yes Historical Provider, MD  phentermine 37.5 MG capsule Take 37.5 mg by mouth every morning.   Yes Historical Provider, MD  butalbital-acetaminophen-caffeine (FIORICET) 50-325-40 MG per tablet Take 1-2 tablets by mouth every 6 (six) hours as needed for headache. 06/03/14 06/03/15  Joni Reining Auriah Hollings, PA-C   BP 113/66  Pulse 69  Temp(Src) 98.1 F (36.7 C) (Oral)  Resp 14  SpO2 100%  LMP 06/03/2012 Physical Exam  Nursing note and vitals reviewed. Constitutional: She is oriented to person, place, and time. She appears well-developed and well-nourished. No distress.  HENT:  Head: Normocephalic and atraumatic.    Mouth/Throat: Oropharynx is clear and moist.  Eyes: Conjunctivae and EOM are normal. Pupils are equal, round, and reactive to light.  Neck: Normal range of motion. Neck supple.  FROM to C-spine. Pt can touch chin to chest without discomfort. No TTP of midline cervical spine.   Cardiovascular: Normal rate, regular rhythm and intact distal pulses.   Pulmonary/Chest: Effort normal and breath sounds normal. No stridor.  Abdominal: Soft. She exhibits no distension and no mass. There is no tenderness. There is no rebound and no guarding.  Musculoskeletal: Normal range of motion.  Neurological: She  is alert and oriented to person, place, and time. She has normal strength. A sensory deficit is present. No cranial nerve deficit. She displays a negative Romberg sign.  II-Visual fields grossly intact. III/IV/VI-Extraocular movements intact.  Pupils reactive bilaterally. V/VII-Smile symmetric, equal eyebrow raise,  facial sensation intact VIII- Hearing grossly  intact IX/X-Normal gag XI-bilateral shoulder shrug XII-midline tongue extension Motor: 5/5 bilaterally with normal tone and bulk Cerebellar: Normal finger-to-nose  and normal heel-to-shin test.   Romberg negative Ambulates with a coordinated gait   Skin: Skin is warm.  Psychiatric: She has a normal mood and affect.    ED Course  Procedures (including critical care time) Labs Review Labs Reviewed  CBC WITH DIFFERENTIAL - Abnormal; Notable for the following:    Neutrophils Relative % 41 (*)    Lymphocytes Relative 51 (*)    Lymphs Abs 4.3 (*)    All other components within normal limits  BASIC METABOLIC PANEL - Abnormal; Notable for the following:    GFR calc non Af Amer 81 (*)    All other components within normal limits    Imaging Review Ct Head Wo Contrast  06/03/2014   CLINICAL DATA:  Headache.  EXAM: CT HEAD WITHOUT CONTRAST  TECHNIQUE: Contiguous axial images were obtained from the base of the skull through the vertex without intravenous contrast.  COMPARISON:  None.  FINDINGS: The ventricles are normal in size and configuration. No extra-axial fluid collections are identified. The gray-white differentiation is normal. No CT findings for acute intracranial process such as hemorrhage or infarction. No mass lesions. The brainstem and cerebellum are grossly normal.  The bony structures are intact. The paranasal sinuses and mastoid air cells are clear. The globes are intact.  IMPRESSION: No acute intracranial findings or mass lesion.   Electronically Signed   By: Loralie Champagne M.D.   On: 06/03/2014 16:29   Mr Brain Wo Contrast  06/03/2014   CLINICAL DATA:  Progressively worsening headache for 6 days with light sensitivity.  EXAM: MRI HEAD WITHOUT CONTRAST  TECHNIQUE: Multiplanar, multiecho pulse sequences of the brain and surrounding structures were obtained without intravenous contrast.  COMPARISON:  CT head earlier in the day.  FINDINGS: No evidence for acute infarction,  hemorrhage, mass lesion, hydrocephalus, or extra-axial fluid. There is no atrophy or white matter disease. Partial empty sella. No cerebellar abnormality. No acute or chronic cortical infarct. Small occiput to C1 joint effusion of doubtful significance. Slight reversal normal cervical lordotic curve. No osseous lesions. No paranasal sinus disease. Negative orbits and mastoids. Shotty cervical adenopathy.  IMPRESSION: No acute intracranial findings. Partial empty sella. Good agreement with prior CT.   Electronically Signed   By: Davonna Belling M.D.   On: 06/03/2014 19:43     EKG Interpretation None      MDM   Final diagnoses:  Acute nonintractable headache, unspecified headache type  Paresthesia    Filed Vitals:   06/03/14 1800 06/03/14 1915 06/03/14 1930 06/03/14 1945  BP: 115/67 110/59 119/63 113/66  Pulse: 65 67 70 69  Temp:      TempSrc:      Resp: SpO2: 100% 100% 99% 100%    Medications  sodium chloride 0.9 % bolus 1,000 mL (0 mLs Intravenous Stopped 06/03/14 1741)  metoCLOPramide (REGLAN) injection 10 mg (10 mg Intravenous Given 06/03/14 1542)  methylPREDNISolone sodium succinate (SOLU-MEDROL) 125 mg/2 mL injection 125 mg (125 mg Intravenous Given 06/03/14 1541)  diphenhydrAMINE (BENADRYL) injection 25 mg (25 mg  Intravenous Given 06/03/14 1541)  aspirin chewable tablet 324 mg (324 mg Oral Given 06/03/14 1727)    Savannah Lopez is a 53 y.o. female presenting with atypical headache and decreased sensation to the right. Orbital area onset 2 days ago. Neuro exam is otherwise unremarkable. Patient is a active daily smoker. Head CT and headache cocktail ordered.  Because patient has risk factors of advanced age, daily smoker and abnormal physical exam findings of inability to differentiate soft touch from pinprick on the face. MRI is ordered after discussion with attending physician.  Patient's headache is largely resolved, she is resting comfortably, and medical to plan of  MRI.  MRI with no acute findings. Have advised patient it is critically important that she stop smoking, start taking a low-dose daily aspirin, followup with primary care and have also recommended neurology consult for both headache and paresthesia. Patient verbalizes her understanding.  Evaluation does not show pathology that would require ongoing emergent intervention or inpatient treatment. Pt is hemodynamically stable and mentating appropriately. Discussed findings and plan with patient/guardian, who agrees with care plan. All questions answered. Return precautions discussed and outpatient follow up given.   New Prescriptions   BUTALBITAL-ACETAMINOPHEN-CAFFEINE (FIORICET) 50-325-40 MG PER TABLET    Take 1-2 tablets by mouth every 6 (six) hours as needed for headache.         Wynetta Emery, PA-C 06/03/14 2029

## 2014-06-03 NOTE — ED Notes (Signed)
Pt reports progressively worsening HA x 6 days with light sensitivity. States 3 days ago she started to feel "tingling" to face and arms. Denies vision changes/blurred vision. Neuro intact, sensation intact, grips equal. AO x4, PERRLA 4 mm.

## 2014-06-05 NOTE — ED Provider Notes (Signed)
Medical screening examination/treatment/procedure(s) were performed by non-physician practitioner and as supervising physician I was immediately available for consultation/collaboration.  Flint Melter, MD 06/05/14 [phone]

## 2014-11-30 ENCOUNTER — Encounter: Payer: Self-pay | Admitting: Gastroenterology

## 2015-02-21 DIAGNOSIS — I4891 Unspecified atrial fibrillation: Secondary | ICD-10-CM | POA: Insufficient documentation

## 2015-02-26 ENCOUNTER — Ambulatory Visit: Payer: Self-pay | Admitting: Cardiology

## 2015-02-26 ENCOUNTER — Encounter: Payer: Self-pay | Admitting: Cardiology

## 2015-02-26 VITALS — BP 110/84 | HR 90 | Ht 61.0 in | Wt 149.9 lb

## 2015-02-26 DIAGNOSIS — I4891 Unspecified atrial fibrillation: Secondary | ICD-10-CM

## 2015-02-26 DIAGNOSIS — I519 Heart disease, unspecified: Secondary | ICD-10-CM

## 2015-02-26 HISTORY — DX: Heart disease, unspecified: I51.9

## 2015-02-26 NOTE — Progress Notes (Signed)
Cardiology Office Revisit Note    Date of Visit: 02/26/2015 Patient: Sherri Harrison   Patients PCP: Michiel SitesSalamone, Jane I, MD Patient DOB: 05/06/1961     Subjective/Reason For Visit     I had the pleasure of seeing Sherri LayerDebbie Simson in cardiology followup on 02/26/2015.  She was feeling well.  She had no complaints.  She is on dialysis.  She is tolerating it well.    Past Cardiac History:  See problem list  Past Medical History   Diagnosis Date    Presbyopia 06/23/2011    Diabetic retinopathy 06/23/2011    Diabetes mellitus     Cardiac disease 02/26/2015     [facility] at [facility] after [facility].   atrial fibrillation History of statin intolerance Most recent echo showed severe mitral regurgitation with preserved LV function Most recent nuclear showed mild apical ischemia     Past Surgical History   Procedure Laterality Date    Tonsillectomy  10/1979    Cesarean section, low transverse  1983 and 1988    [facility]    Other surgical history  2009     surgical debridement of sacral decubitis ulcer    Other surgical history  2006     insertion of PD catheter    Cardiac surgery  2010     CABG x2 - left internal mammary to LAD ; T graft to vein from IMA    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    insulin regular (HUMULIN R) 100 UNIT/ML injection vial Inject 4-10 units as directed. MDD 30 units    B Complex-C-Zn-Folic Acid (NEPHPLEX RX) TABS TAKE 1 TABLET BY MOUTH ONCE A DAY.    insulin NPH (HUMULIN N,NOVOLIN N) 100 UNIT/ML injection vial Inject 6 Units into the skin 2 times daily (before meals)    omeprazole (PRILOSEC)  20 MG capsule Take 20 mg by mouth daily (before breakfast)    Insulin Regular Human (HUMULIN R IJ) Inject as directed    Sevelamer Carbonate (RENVELA PO) Take 800 mg by mouth   Take with high phosphorus    Omeprazole (RA OMEPRAZOLE) 20 MG TBEC Take 1 tablet by mouth daily.    atorvastatin (LIPITOR) 20 MG tablet Take 20 mg by mouth daily (with dinner)    B Complex-C-Zn-Folic Acid (NEPHPLEX RX PO) Take by mouth    WARFARIN SODIUM By no specified route    promethazine (PHENERGAN) 12.5 MG tablet Take 12.5 mg by mouth every 4-6 hours as needed     Vitals and Physical Exam     Natiya's  height is 1.549 m (5\' 1" ) and weight is 68 kg (149 lb 14.6 oz). Her blood pressure is 110/84 and her pulse is 90. Her oxygen saturation is 98%.  Body mass index is 28.34 kg/(m^2).    Physical Exam   Constitutional: She is oriented to person, place, and time.   Eyes: No scleral icterus.   Neck: No JVD present.   Cardiovascular: Normal rate and regular rhythm.    No murmur heard.  Pulmonary/Chest:  Effort normal and breath sounds normal.   Musculoskeletal: She exhibits no edema.   Neurological: She is alert and oriented to person, place, and time.     Laboratory Data     Hematology:   No results found for requested labs within last 365 days.     Chemistry:   No results found for requested labs within last 365 days.     Coagulation Studies:   No results found for requested labs within last 365 days.     Cardiac:   No results found for requested labs within last 365 days.     Lipids:   No results found for requested labs within last 365 days.     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  I want her to  at least take 81 mg of aspirin per day.  That should be the minimum.  She does not want to try another statin.  Cholesterol values are not bad we could consider something like ezetimibe but she is not interested.  I will see her back in one year.       Angelyn Punthomas P Girl Schissler, MD  Electronically signed on 02/26/2015 at 4:33 PM.

## 2016-07-21 ENCOUNTER — Telehealth: Payer: Self-pay | Admitting: Cardiology

## 2016-07-21 NOTE — Telephone Encounter (Signed)
Patient called and cancelled appointment on 10/19 @ [facility].  Patient states she has found another cardiologist.

## 2016-07-29 ENCOUNTER — Ambulatory Visit: Payer: Self-pay | Admitting: Cardiology

## 2017-08-23 ENCOUNTER — Emergency Department (HOSPITAL_COMMUNITY)
Admission: EM | Admit: 2017-08-23 | Discharge: 2017-08-23 | Disposition: A | Discharge status: Home / Self Care | Payer: BLUE CROSS/BLUE SHIELD | Attending: Emergency Medicine | Admitting: Emergency Medicine

## 2017-08-23 ENCOUNTER — Emergency Department (HOSPITAL_COMMUNITY): Payer: BLUE CROSS/BLUE SHIELD

## 2017-08-23 ENCOUNTER — Other Ambulatory Visit: Payer: Self-pay

## 2017-08-23 ENCOUNTER — Emergency Department (HOSPITAL_BASED_OUTPATIENT_CLINIC_OR_DEPARTMENT_OTHER)
Admit: 2017-08-23 | Discharge: 2017-08-23 | Disposition: A | Discharge status: Home / Self Care | Payer: BLUE CROSS/BLUE SHIELD | Attending: Student | Admitting: Student

## 2017-08-23 ENCOUNTER — Encounter (HOSPITAL_COMMUNITY): Payer: Self-pay

## 2017-08-23 DIAGNOSIS — M79605 Pain in left leg: Secondary | ICD-10-CM | POA: Diagnosis present

## 2017-08-23 DIAGNOSIS — Z79899 Other long term (current) drug therapy: Secondary | ICD-10-CM | POA: Insufficient documentation

## 2017-08-23 DIAGNOSIS — M5432 Sciatica, left side: Secondary | ICD-10-CM

## 2017-08-23 DIAGNOSIS — M79609 Pain in unspecified limb: Secondary | ICD-10-CM | POA: Diagnosis not present

## 2017-08-23 DIAGNOSIS — M5442 Lumbago with sciatica, left side: Secondary | ICD-10-CM | POA: Insufficient documentation

## 2017-08-23 DIAGNOSIS — M7989 Other specified soft tissue disorders: Secondary | ICD-10-CM

## 2017-08-23 DIAGNOSIS — Z87891 Personal history of nicotine dependence: Secondary | ICD-10-CM | POA: Diagnosis not present

## 2017-08-23 MED ORDER — MORPHINE SULFATE (PF) 2 MG/ML IV SOLN
2.0000 mg | Freq: Once | INTRAVENOUS | Status: AC
  Administered 2017-08-23: 2 mg via INTRAVENOUS
  Filled 2017-08-23: qty 1

## 2017-08-23 MED ORDER — METHYLPREDNISOLONE 4 MG PO TBPK: ORAL_TABLET | ORAL | 0 refills | Status: DC

## 2017-08-23 NOTE — Progress Notes (Signed)
Left lower extremity venous duplex completed. Mild technical difficulty due to body habitus. No obvious evidence of DVT, superficial thrombosis, or Baker's cyst.  Toma DeitersVirginia Magdelene Ruark, RVS 08/23/2017 11:29 am

## 2017-08-23 NOTE — Discharge Instructions (Signed)
Workup has been normal. Please take medications as prescribed and instructed.  Your ultrasound showed no signs of blood clot.  Your x-ray was unremarkable.  This is likely sciatic pain.  Would take the steroid dose pack. I would also continue to use her NSAIDs, Tylenol, lidocaine patches and your muscle relaxer.  Follow-up with your PCP who can do further imaging if per symptoms or not improving.  Perform the stretches as prescribed.  The x-ray does also note a possible gallstone in your gallbladder.  If you develop any right upper quadrant abdominal pain, nausea or emesis please follow-up with your primary care doctor.  SEEK IMMEDIATE MEDICAL ATTENTION IF: New numbness, tingling, weakness, or problem with the use of your arms or legs.  Severe back pain not relieved with medications.  Change in bowel or bladder control.  Urinary retention.  Numbness in your groin.  Increasing pain in any areas of the body (such as chest or abdominal pain).  Shortness of breath, dizziness or fainting.  Nausea (feeling sick to your stomach), vomiting, fever, or sweats.

## 2017-08-23 NOTE — ED Triage Notes (Signed)
Patient states she has had left leg swelling and swelling x 3-4 days. Much worse this AM.

## 2017-08-23 NOTE — ED Provider Notes (Signed)
[facility]-EMERGENCY DEPT Provider Note   CSN: [id number] Arrival date & time: 08/23/17  0945     History   Chief Complaint Chief Complaint  Patient presents with  . Leg Pain    HPI Savannah Lopez is a 56 y.o. female.  HPI 56 year old African American female with no pertinent past medical history presents to the ED with complaints of left low back pain that radiates to her left leg.  Specifically patient complains of left calf pain that started last night.  She reports associated swelling.  States that she has had sciatic pain in the past and this felt similar 3-4 days ago however it is not going away and now the pain is localizing to her left calf.  Patient states the pain is worse with palpation, certain positions that she sits and laying flat.  Patient states that she is tried several medicines at home including muscle relaxers, NSAIDs, Tylenol with only little relief.  Patient denies any known injury.  Denies any recent long travel, OCP use, hospitalization, hemoptysis.  Patient denies any associated chest pain or shortness of breath. Pt denies any ha, night sweats, hx of ivdu/cancer, loss or bowel or bladder, urinary retention, saddle paresthesias, lower extremity paresthesias.  Patient is ambulatory.  Denies any associated headache, vision change, lightheadedness or dizziness.  History reviewed. No pertinent past medical history.  There are no active problems to display for this patient.   Past Surgical History:  Procedure Laterality Date  . KNEE SURGERY      OB History    No data available       Home Medications    Prior to Admission medications   Medication Sig Start Date End Date Taking? Authorizing Provider  acetaminophen (TYLENOL) 500 MG tablet Take 1,000 mg every 6 (six) hours as needed by mouth for mild pain, moderate pain, fever or headache.   Yes [provider]  baclofen (LIORESAL) 10 MG tablet Take 10 mg 3 (three) times daily  as needed by mouth for muscle spasms.   Yes [provider]  Biotin 5000 MCG TABS Take 1 tablet daily by mouth.   Yes [provider]  buPROPion (WELLBUTRIN SR) 150 MG 12 hr tablet Take 150 mg 2 (two) times daily by mouth. 08/22/17  Yes [provider]  canagliflozin (INVOKANA) 300 MG TABS tablet Take 300 mg daily before breakfast by mouth.   Yes [provider]  DALIRESP 500 MCG TABS tablet Take 500 mcg daily by mouth. 07/15/17  Yes [provider]  ibuprofen (ADVIL,MOTRIN) 200 MG tablet Take 400 mg every 6 (six) hours as needed by mouth for fever, headache, mild pain, moderate pain or cramping.   Yes [provider]  Liraglutide (VICTOZA) 18 MG/3ML SOPN Inject 1.8 mg every other day into the skin.    Yes [provider]  phentermine (ADIPEX-P) 37.5 MG tablet Take 37.5 mg daily by mouth.   Yes [provider]  vitamin B-12 (CYANOCOBALAMIN) 1000 MCG tablet Take 1,000 mcg daily by mouth.   Yes [provider]  methylPREDNISolone (MEDROL DOSEPAK) 4 MG TBPK tablet Take as directed 08/23/17   Rise Mu, PA-C    Family History Family History  Problem Relation Age of Onset  . Cancer Mother   . Rheumatic fever Mother   . Cancer Father     Social History Social History   Tobacco Use  . Smoking status: Former Smoker    Packs/day: 0.25    Types:  Cigarettes  . Smokeless tobacco: Never Used  Substance Use Topics  . Alcohol use: Yes    Comment: occasional wine  . Drug use: No     Allergies   Bee venom; Codeine; and Sulfa antibiotics   Review of Systems Review of Systems  Constitutional: Negative for chills and fever.  Eyes: Negative for visual disturbance.  Respiratory: Negative for shortness of breath.   Cardiovascular: Negative for chest pain.  Gastrointestinal: Negative for nausea and vomiting.  Genitourinary: Negative for dysuria, flank pain, frequency, hematuria and urgency.    Musculoskeletal: Positive for back pain. Negative for gait problem, myalgias, neck pain and neck stiffness.  Skin: Negative for rash.  Neurological: Negative for dizziness, weakness, light-headedness, numbness and headaches.     Physical Exam Updated Vital Signs BP 117/71 (BP Location: Left Arm)   Pulse 74   Temp 98.2 F (36.8 C) (Oral)   Resp 16   Ht 5\' 7"  (1.702 m)   Wt 103.4 kg (228 lb)   LMP 06/03/2012   SpO2 100%   BMI 35.71 kg/m   Physical Exam  Constitutional: She appears well-developed and well-nourished. No distress.  HENT:  Head: Normocephalic and atraumatic.  Eyes: Right eye exhibits no discharge. Left eye exhibits no discharge. No scleral icterus.  Neck: Normal range of motion. Neck supple.  Pulmonary/Chest: No respiratory distress.  Musculoskeletal: Normal range of motion.  Patient with no midline T-spine tenderness.  She does have some midline L-spine tenderness.  This radiates to the left paraspinal region with tense musculature noted.  Patient is a positive straight leg raise test on the left that reproduces the pain that shoots down her leg.  Patient also reports some pain with palpation of the left calf.  I do not appreciate any associated edema.  There is no erythema or warmth noted.  Patient has full range of motion of the left hip joint and left knee without any pain.  No signs of warmth or erythema over the joints.  DP pulses are 2+ bilaterally.  Sensation intact.  Brisk cap refill.  Neurological: She is alert.  Strength 5 out of 5 in lower extremes.  Patellar reflexes are normal.  Skin: Skin is warm and dry. Capillary refill takes less than 2 seconds. No pallor.  Psychiatric: Her behavior is normal. Judgment and thought content normal.  Nursing note and vitals reviewed.    ED Treatments / Results  Labs (all labs ordered are listed, but only abnormal results are displayed) Labs Reviewed  POC URINE PREG, ED    EKG  EKG Interpretation None        Radiology Dg Lumbar Spine Complete  Result Date: 08/23/2017 CLINICAL DATA:  Back pain.  No injury . EXAM: LUMBAR SPINE - COMPLETE 4+ VIEW COMPARISON:  CT 05/25/2007. FINDINGS: Degenerative multilevel changes lumbar spine. No acute bony abnormality identified. No evidence of fracture. Gallstone noted. Pelvic calcifications consistent phleboliths. IMPRESSION: 1. Diffuse multilevel degenerative change. No acute bony abnormality identified. No evidence of fracture. 2. Gallstone. Electronically Signed   By: Maisie Fushomas  Register   On: 08/23/2017 11:37    Procedures Procedures (including critical care time)  Medications Ordered in ED Medications  morphine 2 MG/ML injection 2 mg (not administered)     Initial Impression / Assessment and Plan / ED Course  I have reviewed the triage vital signs and the nursing notes.  Pertinent labs & imaging results that were available during my care of the patient were reviewed by me and considered in  my medical decision making (see chart for details).     Patient presents to the ED for evaluation of low back pain and left leg pain with associated left leg swelling.  On exam patient is neurovascularly intact.  She has no redness or warmth over joints would be concerning for septic arthritis.  Patient is overall well-appearing and nontoxic.  Vital signs reassuring patient is afebrile.  I do not appreciate any significant lower extremity swelling.  Ultrasound was performed that did not show lower extremity DVT.  X-ray of the lumbar spine was performed that showed no acute ab normalities but diffuse degenerative changes and a possible gallstone.  Patient has no right upper quadrant pain and do not feel that this is indicated for further workup at this time.   No neurological deficits and normal neuro exam.  Patient can walk but states is painful.  No loss of bowel or bladder control.  No concern for cauda equina.  No fever, night sweats, weight loss, h/o cancer,  IVDU.    Patient symptoms seem consistent with likely sciatic pain given normal ultrasound in x-ray.  Will start patient on Medrol Dosepak.  Encouraged lidocaine patches, NSAIDs and muscle relaxers which she has at home.  Have given her stretches.  Encouraged her to follow-up with her PCP if symptoms not improving for further workup and imaging including MRI if indicated.   RICE protocol and pain medicine indicated and discussed with patient.   Pt is hemodynamically stable, in NAD, & able to ambulate in the ED. Evaluation does not show pathology that would require ongoing emergent intervention or inpatient treatment. I explained the diagnosis to the patient. Pain has been managed & has no complaints prior to dc. Pt is comfortable with above plan and is stable for discharge at this time. All questions were answered prior to disposition. Strict return precautions for f/u to the ED were discussed. Encouraged follow up with PCP.    Final Clinical Impressions(s) / ED Diagnoses   Final diagnoses:  Left leg pain  Sciatica of left side  Acute left-sided low back pain with left-sided sciatica    ED Discharge Orders        Ordered    methylPREDNISolone (MEDROL DOSEPAK) 4 MG TBPK tablet     08/23/17 1223       Rise MuLeaphart, Zikeria Keough T, PA-C 08/23/17 1235    Tegeler, Canary Brimhristopher J, MD 08/23/17 2037

## 2017-09-07 ENCOUNTER — Other Ambulatory Visit: Payer: Self-pay | Admitting: Internal Medicine

## 2017-09-07 DIAGNOSIS — M5432 Sciatica, left side: Secondary | ICD-10-CM

## 2017-09-16 ENCOUNTER — Other Ambulatory Visit: Payer: Self-pay

## 2017-09-16 ENCOUNTER — Encounter (HOSPITAL_COMMUNITY): Payer: Self-pay | Admitting: Emergency Medicine

## 2017-09-16 ENCOUNTER — Emergency Department (HOSPITAL_COMMUNITY)
Admission: EM | Admit: 2017-09-16 | Discharge: 2017-09-17 | Disposition: A | Discharge status: Home / Self Care | Payer: BLUE CROSS/BLUE SHIELD | Attending: Emergency Medicine | Admitting: Emergency Medicine

## 2017-09-16 ENCOUNTER — Emergency Department (HOSPITAL_COMMUNITY): Payer: BLUE CROSS/BLUE SHIELD

## 2017-09-16 DIAGNOSIS — R079 Chest pain, unspecified: Secondary | ICD-10-CM | POA: Diagnosis present

## 2017-09-16 DIAGNOSIS — Z87891 Personal history of nicotine dependence: Secondary | ICD-10-CM | POA: Diagnosis not present

## 2017-09-16 DIAGNOSIS — R072 Precordial pain: Secondary | ICD-10-CM | POA: Diagnosis not present

## 2017-09-16 DIAGNOSIS — R0602 Shortness of breath: Secondary | ICD-10-CM | POA: Insufficient documentation

## 2017-09-16 DIAGNOSIS — Z79899 Other long term (current) drug therapy: Secondary | ICD-10-CM | POA: Insufficient documentation

## 2017-09-16 DIAGNOSIS — Z7984 Long term (current) use of oral hypoglycemic drugs: Secondary | ICD-10-CM | POA: Diagnosis not present

## 2017-09-16 LAB — BASIC METABOLIC PANEL
Anion gap: 10 (ref 5–15)
BUN: 14 mg/dL (ref 6–20)
CALCIUM: 9.4 mg/dL (ref 8.9–10.3)
CO2: 25 mmol/L (ref 22–32)
CREATININE: 0.73 mg/dL (ref 0.44–1.00)
Chloride: 105 mmol/L (ref 101–111)
Glucose, Bld: 92 mg/dL (ref 65–99)
Potassium: 3.8 mmol/L (ref 3.5–5.1)
SODIUM: 140 mmol/L (ref 135–145)

## 2017-09-16 LAB — CBC
HCT: 39.3 % (ref 36.0–46.0)
Hemoglobin: 12.9 g/dL (ref 12.0–15.0)
MCH: 28.8 pg (ref 26.0–34.0)
MCHC: 32.8 g/dL (ref 30.0–36.0)
MCV: 87.7 fL (ref 78.0–100.0)
PLATELETS: 335 10*3/uL (ref 150–400)
RBC: 4.48 MIL/uL (ref 3.87–5.11)
RDW: 14.5 % (ref 11.5–15.5)
WBC: 10.9 10*3/uL — AB (ref 4.0–10.5)

## 2017-09-16 LAB — I-STAT TROPONIN, ED: TROPONIN I, POC: 0 ng/mL (ref 0.00–0.08)

## 2017-09-16 LAB — D-DIMER, QUANTITATIVE (NOT AT ARMC)

## 2017-09-16 LAB — I-STAT BETA HCG BLOOD, ED (MC, WL, AP ONLY)

## 2017-09-16 MED ORDER — ASPIRIN 81 MG PO CHEW
243.0000 mg | CHEWABLE_TABLET | Freq: Once | ORAL | Status: AC
  Administered 2017-09-16: 243 mg via ORAL
  Filled 2017-09-16: qty 3

## 2017-09-16 NOTE — ED Provider Notes (Signed)
[facility]   CSN: [id number] Arrival date & time: 09/16/17  1654     History   Chief Complaint Chief Complaint  Patient presents with  . Chest Pain    HPI Savannah Lopez is a 56 y.o. female.  Savannah Lopez is a 56 y.o. Female who presents to the emergency department complaining of chest pain with onset today.  Patient reports she was driving home around 13:2412:30 PM today when she felt a flutter in her chest.  This lasted for several seconds and resolved.  She reports she then developed some left-sided sharp chest pain around 3:45 pm that lasted several minutes and was associated with shortness of breath. She reports this has resolved, but she still has some mild left sided chest tightness.  She denies current shortness of breath.  She denies history of MI.  She is a former smoker and quit about 6 months ago.  She denies any recent long travel or exogenous estrogen use.  She has a history of diabetes and denies history of hypertension and HLD.  She denies close family history of MI, DVT or PE.  She denies fevers, coughing, wheezing, abdominal pain, nausea, vomiting, numbness, tingling, leg pain, leg swelling, weakness, lightheadedness, dizziness or syncope.   The history is provided by the patient and medical records. No language interpreter was used.    History reviewed. No pertinent past medical history.  There are no active problems to display for this patient.   Past Surgical History:  Procedure Laterality Date  . KNEE SURGERY      OB History    No data available       Home Medications    Prior to Admission medications   Medication Sig Start Date End Date Taking? Authorizing Provider  acetaminophen (TYLENOL) 500 MG tablet Take 1,000 mg every 6 (six) hours as needed by mouth for mild pain, moderate pain, fever or headache.   Yes [provider]  baclofen (LIORESAL) 10 MG tablet Take 10 mg 3 (three) times daily  as needed by mouth for muscle spasms.   Yes [provider]  Biotin 5000 MCG TABS Take 1 tablet daily by mouth.   Yes [provider]  buPROPion (WELLBUTRIN SR) 150 MG 12 hr tablet Take 150 mg 2 (two) times daily by mouth. 08/22/17  Yes [provider]  DALIRESP 500 MCG TABS tablet Take 500 mcg daily by mouth. 07/15/17  Yes [provider]  ibuprofen (ADVIL,MOTRIN) 200 MG tablet Take 400 mg every 6 (six) hours as needed by mouth for fever, headache, mild pain, moderate pain or cramping.   Yes [provider]  Liraglutide (VICTOZA) 18 MG/3ML SOPN Inject 1.8 mg every other day into the skin.    Yes [provider]  metFORMIN (GLUCOPHAGE) 500 MG tablet Take 500 mg 2 (two) times daily by mouth. 08/22/17  Yes [provider]  phentermine (ADIPEX-P) 37.5 MG tablet Take 37.5 mg daily by mouth.   Yes [provider]  traMADol (ULTRAM) 50 MG tablet Take 50 mg by mouth every 6 (six) hours as needed. for pain 09/13/17  Yes [provider]  vitamin B-12 (CYANOCOBALAMIN) 1000 MCG tablet Take 1,000 mcg daily by mouth.   Yes [provider]  canagliflozin (INVOKANA) 300 MG TABS tablet Take 300 mg daily before breakfast by mouth.    [provider]  methylPREDNISolone (MEDROL DOSEPAK) 4 MG TBPK tablet Take as directed Patient not taking: Reported on 09/16/2017  08/23/17   Rise MuLeaphart, Kenneth T, PA-C    Family History Family History  Problem Relation Age of Onset  . Cancer Mother   . Rheumatic fever Mother   . Cancer Father     Social History Social History   Tobacco Use  . Smoking status: Former Smoker    Packs/day: 0.25    Types: Cigarettes  . Smokeless tobacco: Never Used  Substance Use Topics  . Alcohol use: Yes    Comment: occasional wine  . Drug use: No     Allergies   Bee venom; Codeine; and Sulfa antibiotics   Review of Systems Review of Systems  Constitutional: Negative for chills and  fever.  HENT: Negative for congestion and sore throat.   Eyes: Negative for visual disturbance.  Respiratory: Positive for shortness of breath. Negative for cough and wheezing.   Cardiovascular: Positive for chest pain and palpitations. Negative for leg swelling.  Gastrointestinal: Negative for abdominal pain, diarrhea, nausea and vomiting.  Genitourinary: Negative for dysuria.  Musculoskeletal: Negative for back pain and neck pain.  Skin: Negative for rash.  Neurological: Negative for syncope, weakness, light-headedness, numbness and headaches.     Physical Exam Updated Vital Signs BP 130/67 (BP Location: Left Arm)   Pulse 64   Temp 98.2 F (36.8 C) (Oral)   Resp 13   LMP 06/03/2012   SpO2 99%   Physical Exam  Constitutional: She appears well-developed and well-nourished.  Non-toxic appearance. She does not appear ill. No distress.  HENT:  Head: Normocephalic and atraumatic.  Mouth/Throat: Oropharynx is clear and moist.  Eyes: Conjunctivae are normal. Pupils are equal, round, and reactive to light. Right eye exhibits no discharge. Left eye exhibits no discharge.  Neck: Neck supple.  Cardiovascular: Normal rate, regular rhythm, normal heart sounds and intact distal pulses. Exam reveals no gallop and no friction rub.  No murmur heard. Pulses:      Radial pulses are 2+ on the right side, and 2+ on the left side.       Dorsalis pedis pulses are 2+ on the right side, and 2+ on the left side.       Posterior tibial pulses are 2+ on the right side, and 2+ on the left side.  Pulmonary/Chest: Effort normal and breath sounds normal. No accessory muscle usage. No tachypnea. No respiratory distress. She has no wheezes. She has no rales.  Lungs are clear to ascultation bilaterally. Symmetric chest expansion bilaterally. No increased work of breathing. No rales or rhonchi.    Abdominal: Soft. There is no tenderness.  Musculoskeletal: She exhibits no edema.       Right lower leg: She  exhibits no tenderness and no edema.       Left lower leg: She exhibits no tenderness and no edema.  Lymphadenopathy:    She has no cervical adenopathy.  Neurological: She is alert. Coordination normal.  Skin: Skin is warm and dry. Capillary refill takes less than 2 seconds. No rash noted. She is not diaphoretic. No erythema. No pallor.  Psychiatric: She has a normal mood and affect. Her behavior is normal.  Nursing note and vitals reviewed.    ED Treatments / Results  Labs (all labs ordered are listed, but only abnormal results are displayed) Labs Reviewed  CBC - Abnormal; Notable for the following components:      Result Value   WBC 10.9 (*)    All other components within normal limits  BASIC METABOLIC PANEL  D-DIMER, QUANTITATIVE (NOT AT  [facility])  I-STAT TROPONIN, ED  I-STAT BETA HCG BLOOD, ED ([facility], [facility], [facility] ONLY)  I-STAT TROPONIN, ED    EKG  EKG Interpretation  Date/Time:  Friday September 16 2017 17:01:26 EST Ventricular Rate:  83 PR Interval:  140 QRS Duration: 94 QT Interval:  374 QTC Calculation: 439 R Axis:   -19 Text Interpretation:  Normal sinus rhythm Possible Left atrial enlargement Incomplete right bundle branch block Left ventricular hypertrophy Abnormal ECG flipped t wave in lead III resolved from prior Otherwise no significant change Confirmed by Melene Plan [phone]) on 09/16/2017 11:05:03 PM       Radiology Dg Chest 2 View  Result Date: 09/16/2017 CLINICAL DATA:  Onset chest pain and shortness of breath today. EXAM: CHEST  2 VIEW COMPARISON:  None. FINDINGS: The lungs are clear. Heart size is normal. No pneumothorax or pleural effusion. No acute bony abnormality. IMPRESSION: No acute disease. Electronically Signed   By: Drusilla Kanner M.D.   On: 09/16/2017 18:30    Procedures Procedures (including critical care time)  Medications Ordered in ED Medications  aspirin chewable tablet 243 mg (243 mg Oral Given 09/16/17 2351)     Initial Impression /  Assessment and Plan / ED Course  I have reviewed the triage vital signs and the nursing notes.  Pertinent labs & imaging results that were available during my care of the patient were reviewed by me and considered in my medical decision making (see chart for details).     This is a 56 y.o. Female who presents to the emergency department complaining of chest pain with onset today.  Patient reports she was driving home around 60:45 PM today when she felt a flutter in her chest.  This lasted for several seconds and resolved.  She reports she then developed some left-sided sharp chest pain around 3:45 pm that lasted several minutes and was associated with shortness of breath. She reports this has resolved, but she still has some mild left sided chest tightness.  She denies current shortness of breath.  She denies history of MI.  She is a former smoker and quit about 6 months ago.  She denies any recent long travel or exogenous estrogen use.  She has a history of diabetes and denies history of hypertension and HLD.  She denies close family history of MI, DVT or PE.  Patient presented with chest pain to the ED. Patient is to be discharged with recommendation to follow up with PCP in regards to today's hospital visit. Chest pain is not likely of cardiac or pulmonary etiology due to presentation, d-dimer negative, VSS, no tracheal deviation, no JVD or new murmur, RRR, breath sounds equal bilaterally, EKG without acute abnormalities, negative troponin, and negative delta troponin and negative CXR. HEART score is 3. Patient has been advised to return to the ED if chest pain becomes exertional, associated with diaphoresis or nausea, radiates to left jaw/arm, worsens or becomes concerning in any way. Patient appears reliable for follow up and is agreeable to discharge. She has no chest pain at reevaluation.  I advised the patient to follow-up with their primary care provider this week. She also has a new appointment  with cardiology coming up. I encouraged her to keep her appointment.  I advised the patient to return to the emergency department with new or worsening symptoms or new concerns. The patient verbalized understanding and agreement with plan.    This patient was discussed with Dr. Adela Lank who agrees with assessment and plan.  Final Clinical Impressions(s) / ED Diagnoses   Final diagnoses:  Precordial pain    ED Discharge Orders    None       Lorene Dy 09/17/17 0113    Tilden Fossa, MD 09/18/17 [phone]

## 2017-09-16 NOTE — ED Triage Notes (Signed)
Pt reports she began to have L side CP an hour ago. Denies any precipitating symptoms. Also endorses SOB. Pain worse when taking deep breath. No recent travel or leg swelling.

## 2017-09-17 LAB — I-STAT TROPONIN, ED: TROPONIN I, POC: 0 ng/mL (ref 0.00–0.08)

## 2017-09-19 ENCOUNTER — Other Ambulatory Visit: Payer: BLUE CROSS/BLUE SHIELD

## 2018-06-11 ENCOUNTER — Emergency Department (HOSPITAL_COMMUNITY): Payer: BLUE CROSS/BLUE SHIELD

## 2018-06-11 ENCOUNTER — Encounter (HOSPITAL_COMMUNITY): Payer: Self-pay | Admitting: Emergency Medicine

## 2018-06-11 ENCOUNTER — Emergency Department (HOSPITAL_COMMUNITY)
Admission: EM | Admit: 2018-06-11 | Discharge: 2018-06-11 | Disposition: A | Discharge status: Home / Self Care | Payer: BLUE CROSS/BLUE SHIELD | Attending: Emergency Medicine | Admitting: Emergency Medicine

## 2018-06-11 ENCOUNTER — Other Ambulatory Visit: Payer: Self-pay

## 2018-06-11 DIAGNOSIS — Z79899 Other long term (current) drug therapy: Secondary | ICD-10-CM | POA: Insufficient documentation

## 2018-06-11 DIAGNOSIS — R202 Paresthesia of skin: Secondary | ICD-10-CM | POA: Insufficient documentation

## 2018-06-11 DIAGNOSIS — Z87891 Personal history of nicotine dependence: Secondary | ICD-10-CM | POA: Diagnosis not present

## 2018-06-11 DIAGNOSIS — R0789 Other chest pain: Secondary | ICD-10-CM | POA: Diagnosis not present

## 2018-06-11 DIAGNOSIS — R0602 Shortness of breath: Secondary | ICD-10-CM | POA: Diagnosis not present

## 2018-06-11 DIAGNOSIS — Z7984 Long term (current) use of oral hypoglycemic drugs: Secondary | ICD-10-CM | POA: Diagnosis not present

## 2018-06-11 DIAGNOSIS — J029 Acute pharyngitis, unspecified: Secondary | ICD-10-CM | POA: Diagnosis not present

## 2018-06-11 DIAGNOSIS — Z7982 Long term (current) use of aspirin: Secondary | ICD-10-CM | POA: Diagnosis not present

## 2018-06-11 DIAGNOSIS — R200 Anesthesia of skin: Secondary | ICD-10-CM | POA: Insufficient documentation

## 2018-06-11 DIAGNOSIS — R232 Flushing: Secondary | ICD-10-CM | POA: Diagnosis not present

## 2018-06-11 DIAGNOSIS — R0981 Nasal congestion: Secondary | ICD-10-CM | POA: Diagnosis not present

## 2018-06-11 LAB — BASIC METABOLIC PANEL
Anion gap: 11 (ref 5–15)
BUN: 21 mg/dL — AB (ref 6–20)
CO2: 25 mmol/L (ref 22–32)
CREATININE: 0.95 mg/dL (ref 0.44–1.00)
Calcium: 9.1 mg/dL (ref 8.9–10.3)
Chloride: 107 mmol/L (ref 98–111)
Glucose, Bld: 115 mg/dL — ABNORMAL HIGH (ref 70–99)
POTASSIUM: 4.4 mmol/L (ref 3.5–5.1)
SODIUM: 143 mmol/L (ref 135–145)

## 2018-06-11 LAB — CBC
HEMATOCRIT: 40 % (ref 36.0–46.0)
Hemoglobin: 12.9 g/dL (ref 12.0–15.0)
MCH: 29.3 pg (ref 26.0–34.0)
MCHC: 32.3 g/dL (ref 30.0–36.0)
MCV: 90.9 fL (ref 78.0–100.0)
PLATELETS: 299 10*3/uL (ref 150–400)
RBC: 4.4 MIL/uL (ref 3.87–5.11)
RDW: 14.4 % (ref 11.5–15.5)
WBC: 6.5 10*3/uL (ref 4.0–10.5)

## 2018-06-11 LAB — I-STAT TROPONIN, ED
TROPONIN I, POC: 0.01 ng/mL (ref 0.00–0.08)
Troponin i, poc: 0.01 ng/mL (ref 0.00–0.08)

## 2018-06-11 NOTE — ED Provider Notes (Signed)
[facility]-EMERGENCY DEPT Provider Note   CSN: [id number] Arrival date & time: 06/11/18  4098     History   Chief Complaint Chief Complaint  Patient presents with  . Chest Pain    HPI Savannah Lopez is a 57 y.o. female with history of vitamin D deficiency presents today for evaluation of acute onset, intermittent chest pain which began this morning.  She states that she awoke around 4:30 AM and shortly thereafter began to "not feel right".  She describes left-sided chest pressure and numbness and tingling of her left upper extremity which is at times burning in sensation.  Pain in the chest will last for a few minutes and then resolve and occurs multiple times an hour.  No aggravating or alleviating factors noted.  She notes associated mild shortness of breath which is not exertional, positional, or pleuritic.  She denies nausea, vomiting, lightheadedness but she states she will occasionally feel "flushed".  She is a former smoker and quit 1 year ago.  She is currently taking metformin, phentermine, and Victoza for weight loss but is not diabetic per the patient.  She states she will have 2 alcoholic beverages a week and denies excessive caffeine intake or any recreational drug use.  No leg swelling, recent travel or surgery, hemoptysis, or prior history of DVT or PE.  She is not on estrogen hormone replacement therapy.  Of note, she has been experiencing nasal congestion and sore throat for the past week or so.  She denies fever.  It she has been seen and evaluated by her PCP for this and she states her symptoms are improving.  The history is provided by the patient.    History reviewed. No pertinent past medical history.  There are no active problems to display for this patient.   Past Surgical History:  Procedure Laterality Date  . KNEE SURGERY       OB History   None      Home Medications    Prior to Admission medications   Medication Sig Start Date  End Date Taking? Authorizing Provider  acetaminophen (TYLENOL) 500 MG tablet Take 1,000 mg every 6 (six) hours as needed by mouth for mild pain, moderate pain, fever or headache.   Yes [provider]  aspirin EC 81 MG tablet Take 81 mg by mouth daily.   Yes [provider]  baclofen (LIORESAL) 10 MG tablet Take 10 mg 3 (three) times daily as needed by mouth for muscle spasms.   Yes [provider]  Biotin 5000 MCG TABS Take 10,000 mcg by mouth daily.    Yes [provider]  buPROPion (WELLBUTRIN SR) 150 MG 12 hr tablet Take 150 mg 2 (two) times daily by mouth. 08/22/17  Yes [provider]  canagliflozin (INVOKANA) 300 MG TABS tablet Take 300 mg daily before breakfast by mouth.   Yes [provider]  DALIRESP 500 MCG TABS tablet Take 500 mcg daily by mouth. 07/15/17  Yes [provider]  ferrous sulfate 325 (65 FE) MG tablet Take 325 mg by mouth daily with breakfast.   Yes [provider]  ibuprofen (ADVIL,MOTRIN) 200 MG tablet Take 600 mg by mouth every 6 (six) hours as needed for fever, headache, mild pain, moderate pain or cramping.    Yes [provider]  Liraglutide (VICTOZA) 18 MG/3ML SOPN Inject 1.8 mg every other day into the skin.    Yes [provider]  metFORMIN (GLUCOPHAGE) 500 MG tablet Take  500 mg 2 (two) times daily by mouth. 08/22/17  Yes [provider]  phentermine (ADIPEX-P) 37.5 MG tablet Take 37.5 mg daily by mouth.   Yes [provider]  triamcinolone cream (KENALOG) 0.1 % Apply 1 application topically daily as needed (for rash).   Yes [provider]  vitamin B-12 (CYANOCOBALAMIN) 1000 MCG tablet Take 1,000 mcg daily by mouth.   Yes [provider]  Vitamin D, Ergocalciferol, (DRISDOL) 50000 units CAPS capsule Take 50,000 Units by mouth every Thursday. 05/29/18  Yes [provider]    Family History Family History  Problem Relation Age of  Onset  . Cancer Mother   . Rheumatic fever Mother   . Cancer Father     Social History Social History   Tobacco Use  . Smoking status: Former Smoker    Packs/day: 0.25    Types: Cigarettes  . Smokeless tobacco: Never Used  Substance Use Topics  . Alcohol use: Yes    Comment: occasional wine  . Drug use: No     Allergies   Bee venom; Codeine; and Sulfa antibiotics   Review of Systems Review of Systems  Constitutional: Negative for chills, diaphoresis and fever.       +"flushed"  HENT: Positive for congestion, sinus pressure and sore throat.   Respiratory: Positive for shortness of breath.   Cardiovascular: Positive for chest pain. Negative for palpitations and leg swelling.  Gastrointestinal: Negative for abdominal pain, nausea and vomiting.  All other systems reviewed and are negative.    Physical Exam Updated Vital Signs BP 132/79 (BP Location: Left Arm)   Pulse 63   Temp 98 F (36.7 C) (Oral)   Resp 15   Ht 5\' 7"  (1.702 m)   Wt 106.1 kg   LMP 06/03/2012   SpO2 99%   BMI 36.65 kg/m   Physical Exam  Constitutional: She appears well-developed and well-nourished. No distress.  HENT:  Head: Normocephalic and atraumatic.  TMs without erythema or bulging bilaterally.  Nasal septum midline with mild mucosal edema bilaterally.  No maxillary or frontal sinus tenderness.  Posterior oropharynx with mild erythema but no tonsillar hypertrophy, exudates, uvular deviation, or trismus.  Tolerating secretions without difficulty.  No sublingual abnormalities  Eyes: Conjunctivae are normal. Right eye exhibits no discharge. Left eye exhibits no discharge.  Neck: Normal range of motion. Neck supple. No JVD present. No tracheal deviation present.  Cardiovascular: Normal rate and regular rhythm.  Pulses:      Carotid pulses are 2+ on the right side, and 2+ on the left side.      Radial pulses are 2+ on the right side, and 2+ on the left side.       Dorsalis pedis pulses are 2+  on the right side, and 2+ on the left side.       Posterior tibial pulses are 2+ on the right side, and 2+ on the left side.  Homans sign absent bilaterally, no lower extremity edema, no palpable cords, compartments are soft   Pulmonary/Chest: Effort normal and breath sounds normal. No accessory muscle usage. No respiratory distress.  Focal tenderness to the left anterior chest wall in the parasternal region.  No deformity, crepitus, ecchymosis, or flail segment noted.  Speaking in full sentences without difficulty.    Abdominal: Soft. Bowel sounds are normal. She exhibits no distension. There is no tenderness.  Musculoskeletal: She exhibits no edema.       Right lower leg: Normal. She exhibits no tenderness  and no edema.       Left lower leg: Normal. She exhibits no tenderness and no edema.  Neurological: She is alert.  Skin: Skin is warm and dry. No erythema.  Psychiatric: She has a normal mood and affect. Her behavior is normal.  Nursing note and vitals reviewed.    ED Treatments / Results  Labs (all labs ordered are listed, but only abnormal results are displayed) Labs Reviewed  BASIC METABOLIC PANEL - Abnormal; Notable for the following components:      Result Value   Glucose, Bld 115 (*)    BUN 21 (*)    All other components within normal limits  CBC  I-STAT TROPONIN, ED  I-STAT TROPONIN, ED    EKG EKG Interpretation  Date/Time:  Sunday June 11 2018 06:34:20 EDT Ventricular Rate:  77 PR Interval:    QRS Duration: 97 QT Interval:  401 QTC Calculation: 454 R Axis:   -11 Text Interpretation:  Sinus rhythm Probable left atrial enlargement Left ventricular hypertrophy Borderline T abnormalities, inferior leads Baseline wander in lead(s) III V2 No significant change since last tracing Confirmed by Lorre Nick ([id number]) on 06/11/2018 8:39:21 AM Also confirmed by Lorre Nick ([id number]), editor [place], Tamera Punt [phone])  on 06/11/2018 9:37:22 AM   Radiology Dg Chest 2  View  Result Date: 06/11/2018 CLINICAL DATA:  57 year old female with chest pain. EXAM: CHEST - 2 VIEW COMPARISON:  Chest radiograph dated 09/16/2017 FINDINGS: The heart size and mediastinal contours are within normal limits. Both lungs are clear. The visualized skeletal structures are unremarkable. IMPRESSION: No active cardiopulmonary disease. Electronically Signed   By: Elgie Collard M.D.   On: 06/11/2018 06:59    Procedures Procedures (including critical care time)  Medications Ordered in ED Medications - No data to display   Initial Impression / Assessment and Plan / ED Course  I have reviewed the triage vital signs and the nursing notes.  Pertinent labs & imaging results that were available during my care of the patient were reviewed by me and considered in my medical decision making (see chart for details).     Patient with intermittent left-sided chest pressure and left upper extremity pain, numbness, and tingling.  Symptoms began this morning.  She is afebrile, initially mildly hypertensive with resolution on reevaluation.  She is nontoxic in appearance.  Chest pain in part reproducible on palpation, could suggest musculoskeletal etiology.  Pain is not exertional, pleuritic, or positional.  EKG shows no ischemic changes, no acute changes from last tracing.  No evidence of arrhythmia.  Serial troponins are negative and patient has a HEART score of 2 making her low risk for adverse cardiac event.  Remainder of lab work reviewed by me shows no leukocytosis, no anemia, no metabolic derangements.  Chest x-ray shows no acute cardiopulmonary abnormality, no evidence of pneumonia or pleural effusion.  Doubt PE in the absence of risk factors and no hypoxia, tachycardia, or increased work of breathing.  Doubt dissection, pericarditis, myocarditis, cardiac tamponade, esophageal rupture.  On reevaluation, patient is resting comfortably no apparent distress.  She states she is not expensing any  chest pain at this time.  Stable for discharge home with follow-up with PCP or cardiology for reevaluation.  Discussed strict ED return precautions.  Patient and patient's husband verbalized understanding of and agreement with plan and patient is stable for discharge home at this time. Patient seen and evaluated by Dr. Freida Busman who agrees with assessment and plan at this time.  Final Clinical Impressions(s) / ED Diagnoses   Final diagnoses:  Atypical chest pain    ED Discharge Orders    None       Bennye Alm 06/11/18 1153    Lorre Nick, MD 06/11/18 1541

## 2018-06-11 NOTE — ED Provider Notes (Signed)
Medical screening examination/treatment/procedure(s) were performed by non-physician practitioner and as supervising physician I was immediately available for consultation/collaboration.  EKG Interpretation  Date/Time:  Sunday June 11 2018 06:34:20 EDT Ventricular Rate:  77 PR Interval:    QRS Duration: 97 QT Interval:  401 QTC Calculation: 454 R Axis:   -11 Text Interpretation:  Sinus rhythm Probable left atrial enlargement Left ventricular hypertrophy Borderline T abnormalities, inferior leads Baseline wander in lead(s) III V2 No significant change since last tracing Confirmed by Lorre Nick ([id number]) on 06/11/2018 8:39:21 AM Also confirmed by Lorre Nick ([id number]), editor Sheppard Evens [phone])  on 06/11/2018 9:44:53 AM 57 year old female who has had intermittent substernal sharp chest discomfort which lasts for minutes to seconds and not associated with dyspnea, diaphoresis.  Heart score is less than 4.  Delta troponin negative.  Outpatient referrals given   Lorre Nick, MD 06/11/18 1114

## 2018-06-11 NOTE — ED Triage Notes (Signed)
Pt reports waking up tonight with chest pain and pain in left arm.

## 2018-06-11 NOTE — Discharge Instructions (Signed)
Alternate 600 mg of ibuprofen and [phone] mg of Tylenol every 3-4 hours as needed for pain. Do not exceed 4000 mg of Tylenol daily.  Call your primary care physician to set up follow-up.  This may include an echocardiogram or a stress test.  You can also follow-up with a cardiologist for further evaluation.  Return to the emergency department if any concerning signs or symptoms develop such as fevers, worsening chest pain or shortness of breath, persistent vomiting.

## 2020-03-22 ENCOUNTER — Other Ambulatory Visit: Payer: Self-pay

## 2020-03-22 ENCOUNTER — Emergency Department: Payer: BC Managed Care – PPO

## 2020-03-22 ENCOUNTER — Emergency Department
Admission: EM | Admit: 2020-03-22 | Discharge: 2020-03-22 | Disposition: A | Discharge status: Home / Self Care | Payer: BC Managed Care – PPO | Attending: Emergency Medicine | Admitting: Emergency Medicine

## 2020-03-22 DIAGNOSIS — Z7982 Long term (current) use of aspirin: Secondary | ICD-10-CM | POA: Insufficient documentation

## 2020-03-22 DIAGNOSIS — R202 Paresthesia of skin: Secondary | ICD-10-CM | POA: Insufficient documentation

## 2020-03-22 DIAGNOSIS — R791 Abnormal coagulation profile: Secondary | ICD-10-CM | POA: Insufficient documentation

## 2020-03-22 DIAGNOSIS — R519 Headache, unspecified: Secondary | ICD-10-CM | POA: Diagnosis not present

## 2020-03-22 DIAGNOSIS — Z79899 Other long term (current) drug therapy: Secondary | ICD-10-CM | POA: Diagnosis not present

## 2020-03-22 DIAGNOSIS — Z87891 Personal history of nicotine dependence: Secondary | ICD-10-CM | POA: Diagnosis not present

## 2020-03-22 LAB — APTT: aPTT: 26 seconds (ref 24–36)

## 2020-03-22 LAB — CBC
HCT: 38.8 % (ref 36.0–46.0)
Hemoglobin: 12.9 g/dL (ref 12.0–15.0)
MCH: 29.5 pg (ref 26.0–34.0)
MCHC: 33.2 g/dL (ref 30.0–36.0)
MCV: 88.8 fL (ref 80.0–100.0)
Platelets: 315 10*3/uL (ref 150–400)
RBC: 4.37 MIL/uL (ref 3.87–5.11)
RDW: 14.1 % (ref 11.5–15.5)
WBC: 10.7 10*3/uL — ABNORMAL HIGH (ref 4.0–10.5)
nRBC: 0 % (ref 0.0–0.2)

## 2020-03-22 LAB — DIFFERENTIAL
Abs Immature Granulocytes: 0.03 10*3/uL (ref 0.00–0.07)
Basophils Absolute: 0.1 10*3/uL (ref 0.0–0.1)
Basophils Relative: 1 %
Eosinophils Absolute: 0.1 10*3/uL (ref 0.0–0.5)
Eosinophils Relative: 1 %
Immature Granulocytes: 0 %
Lymphocytes Relative: 30 %
Lymphs Abs: 3.2 10*3/uL (ref 0.7–4.0)
Monocytes Absolute: 0.7 10*3/uL (ref 0.1–1.0)
Monocytes Relative: 7 %
Neutro Abs: 6.6 10*3/uL (ref 1.7–7.7)
Neutrophils Relative %: 61 %

## 2020-03-22 LAB — COMPREHENSIVE METABOLIC PANEL
ALT: 20 U/L (ref 0–44)
AST: 17 U/L (ref 15–41)
Albumin: 4.1 g/dL (ref 3.5–5.0)
Alkaline Phosphatase: 61 U/L (ref 38–126)
Anion gap: 11 (ref 5–15)
BUN: 14 mg/dL (ref 6–20)
CO2: 23 mmol/L (ref 22–32)
Calcium: 9.2 mg/dL (ref 8.9–10.3)
Chloride: 101 mmol/L (ref 98–111)
Creatinine, Ser: 0.92 mg/dL (ref 0.44–1.00)
GFR calc Af Amer: >60 mL/min (ref >60)
GFR calc non Af Amer: >60 mL/min (ref >60)
Glucose, Bld: 102 mg/dL — ABNORMAL HIGH (ref 70–99)
Potassium: 3.8 mmol/L (ref 3.5–5.1)
Sodium: 135 mmol/L (ref 135–145)
Total Bilirubin: 0.6 mg/dL (ref 0.3–1.2)
Total Protein: 7.2 g/dL (ref 6.5–8.1)

## 2020-03-22 LAB — PROTIME-INR
INR: 1.1 (ref 0.8–1.2)
Prothrombin Time: 13.9 seconds (ref 11.4–15.2)

## 2020-03-22 MED ORDER — HALOPERIDOL LACTATE 5 MG/ML IJ SOLN
5.0000 mg | Freq: Once | INTRAMUSCULAR | Status: AC
  Administered 2020-03-22: 5 mg via INTRAVENOUS
  Filled 2020-03-22: qty 1

## 2020-03-22 MED ORDER — SODIUM CHLORIDE 0.9 % IV BOLUS
1000.0000 mL | Freq: Once | INTRAVENOUS | Status: AC
  Administered 2020-03-22: 1000 mL via INTRAVENOUS

## 2020-03-22 NOTE — ED Notes (Signed)
Pt sleeping at present, VSS

## 2020-03-22 NOTE — ED Provider Notes (Signed)
[facility] Emergency Department Provider Note   ____________________________________________   I have reviewed the triage vital signs and the nursing notes.   HISTORY  Chief Complaint Headache  History limited by: Not Limited   HPI Savannah Lopez is a 59 y.o. female who presents to the emergency department today with primary concern for headache.  Patient dates that she has been getting frequent headaches.  Today the headache was located on the left side.  It was severe.  She then started noticing some numbness to the left side of her face and describes that it felt like her left arm and leg went to sleep.  She states that she has had the symptoms of left arm and leg falling asleep in the past.  She has been attributing it to carpal tunnel.  Patient also complains of frequent left leg cramping.  The patient has used baclofen in the past and is helped with this.  The patient denies any recent head trauma.  Denies any recent illness.  At the time my exam states that the tingling has improved.   Records reviewed. Per medical record review patient has a history of left leg pain.  History reviewed. No pertinent past medical history.  There are no problems to display for this patient.   Past Surgical History:  Procedure Laterality Date  . KNEE SURGERY      Prior to Admission medications   Medication Sig Start Date End Date Taking? Authorizing Provider  acetaminophen (TYLENOL) 500 MG tablet Take 1,000 mg every 6 (six) hours as needed by mouth for mild pain, moderate pain, fever or headache.    [provider]  aspirin EC 81 MG tablet Take 81 mg by mouth daily.    [provider]  baclofen (LIORESAL) 10 MG tablet Take 10 mg 3 (three) times daily as needed by mouth for muscle spasms.    [provider]  Biotin 5000 MCG TABS Take 10,000 mcg by mouth daily.     [provider]  buPROPion (WELLBUTRIN SR) 150 MG 12 hr tablet Take  150 mg 2 (two) times daily by mouth. 08/22/17   [provider]  canagliflozin (INVOKANA) 300 MG TABS tablet Take 300 mg daily before breakfast by mouth.    [provider]  DALIRESP 500 MCG TABS tablet Take 500 mcg daily by mouth. 07/15/17   [provider]  ferrous sulfate 325 (65 FE) MG tablet Take 325 mg by mouth daily with breakfast.    [provider]  ibuprofen (ADVIL,MOTRIN) 200 MG tablet Take 600 mg by mouth every 6 (six) hours as needed for fever, headache, mild pain, moderate pain or cramping.     [provider]  Liraglutide (VICTOZA) 18 MG/3ML SOPN Inject 1.8 mg every other day into the skin.     [provider]  metFORMIN (GLUCOPHAGE) 500 MG tablet Take 500 mg 2 (two) times daily by mouth. 08/22/17   [provider]  phentermine (ADIPEX-P) 37.5 MG tablet Take 37.5 mg daily by mouth.    [provider]  triamcinolone cream (KENALOG) 0.1 % Apply 1 application topically daily as needed (for rash).    [provider]  vitamin B-12 (CYANOCOBALAMIN) 1000 MCG tablet Take 1,000 mcg daily by mouth.    [provider]  Vitamin D, Ergocalciferol, (DRISDOL) 50000 units CAPS capsule Take 50,000 Units by mouth every Thursday. 05/29/18   [provider]    Allergies Bee venom, Codeine, and Sulfa antibiotics  Family History  Problem Relation Age of Onset  . Cancer Mother   . Rheumatic fever Mother   . Cancer Father     Social History Social History   Tobacco Use  . Smoking status: Former Smoker    Packs/day: 0.25    Types: Cigarettes  . Smokeless tobacco: Never Used  Vaping Use  . Vaping Use: Never used  Substance Use Topics  . Alcohol use: Yes    Comment: occasional wine  . Drug use: No    Review of Systems Constitutional: No fever/chills Eyes: No visual changes. ENT: No sore throat. Cardiovascular: Denies chest pain. Respiratory: Denies shortness of breath. Gastrointestinal:  No abdominal pain.  No nausea, no vomiting.  No diarrhea.   Genitourinary: Negative for dysuria. Musculoskeletal: Positive for left leg cramping.  Skin: Negative for rash. Neurological: Positive for left sided headache, left facial numbness, left upper and lower extremity tingling.  ____________________________________________   PHYSICAL EXAM:  VITAL SIGNS: ED Triage Vitals  Enc Vitals Group     BP 03/22/20 1454 134/65     Pulse Rate 03/22/20 1451 69     Resp 03/22/20 1451 18     Temp 03/22/20 1451 98 F (36.7 C)     Temp Source 03/22/20 1451 Oral     SpO2 03/22/20 1451 100 %     Weight 03/22/20 1452 246 lb (111.6 kg)     Height 03/22/20 1452 5\' 8"  (1.727 m)     Head Circumference --      Peak Flow --      Pain Score 03/22/20 1452 4    Constitutional: Alert and oriented.  Eyes: Conjunctivae are normal.  ENT      Head: Normocephalic and atraumatic.      Nose: No congestion/rhinnorhea.      Mouth/Throat: Mucous membranes are moist.      Neck: No stridor. Hematological/Lymphatic/Immunilogical: No cervical lymphadenopathy. Cardiovascular: Normal rate, regular rhythm.  No murmurs, rubs, or gallops.  Respiratory: Normal respiratory effort without tachypnea nor retractions. Breath sounds are clear and equal bilaterally. No wheezes/rales/rhonchi. Gastrointestinal: Soft and non tender. No rebound. No guarding.  Genitourinary: Deferred Musculoskeletal: Normal range of motion in all extremities. No lower extremity edema. Neurologic:  Normal speech and language. No gross focal neurologic deficits are appreciated.  Skin:  Skin is warm, dry and intact. No rash noted. Psychiatric: Mood and affect are normal. Speech and behavior are normal. Patient exhibits appropriate insight and judgment.  ____________________________________________    LABS (pertinent positives/negatives)  CMP wnl except glu 102 CBC wbc 10.7, hgb 12.9, plt  315  ____________________________________________   EKG  I, Nance Pear, attending physician, personally viewed and interpreted this EKG  EKG Time: 1456 Rate: 68 Rhythm: normal sinus rhythm Axis: left axis deviation Intervals: qtc 433 QRS: LVH ST changes: no st elevation Impression: abnormal ekg  ____________________________________________    RADIOLOGY  CT head Brain parenchyma appears normal.  ____________________________________________   PROCEDURES  Procedures  ____________________________________________   INITIAL IMPRESSION / ASSESSMENT AND PLAN / ED COURSE  Pertinent labs & imaging results that were available during my care of the patient were reviewed by me and considered in my medical decision making (see chart for details).   Patient presented to the emergency department today because of concerns for bad headache as well as left-sided numbness and tingling.  On exam patient had some subjective numbness but I do not appreciate any focal neuro deficits.  Does sound like some of these products are somewhat  recurrent and chronic in nature.  CT head did not show any acute findings.  This point I doubt stroke.  Patient was given IV fluids and medication to help with her headache.  This did improve both her headache as well as the tingling.  At this point I do wonder if patient suffers from migraines and perhaps had a complex migraine today.  Additionally could be more chronic nerve type damage such as carpal tunnel for the hand and degenerative disc disease for the leg.  However at this point I have low suspicion for central lesion.  Did discuss with patient portance of following up with neurology.  ____________________________________________   FINAL CLINICAL IMPRESSION(S) / ED DIAGNOSES  Final diagnoses:  Nonintractable headache, unspecified chronicity pattern, unspecified headache type  Paresthesias     Note: This dictation was prepared with Dragon  dictation. Any transcriptional errors that result from this process are unintentional     Phineas Semen, MD 03/22/20 2046

## 2020-03-22 NOTE — ED Triage Notes (Signed)
Pt comes POV with numbness to entire left side of body. Pt LKW 0930. VAN neg. Neurologically intact aside from numbness/tingling.

## 2020-03-22 NOTE — ED Notes (Signed)
VS obtained by this RN. Pt c/o continued HA. Pt continues to sit in wheelchair at this time. Apologized and explained delay to pt.

## 2020-03-22 NOTE — ED Notes (Signed)
This RN attempted IV insertion x2 without success.  

## 2020-03-22 NOTE — Discharge Instructions (Addendum)
Please seek medical attention for any high fevers, chest pain, shortness of breath, change in behavior, persistent vomiting, bloody stool or any other new or concerning symptoms.  

## 2021-03-05 ENCOUNTER — Other Ambulatory Visit: Payer: Self-pay | Admitting: Obstetrics and Gynecology

## 2021-03-05 DIAGNOSIS — Z9189 Other specified personal risk factors, not elsewhere classified: Secondary | ICD-10-CM

## 2021-03-12 ENCOUNTER — Other Ambulatory Visit: Payer: Self-pay | Admitting: Obstetrics and Gynecology

## 2021-03-12 DIAGNOSIS — Z803 Family history of malignant neoplasm of breast: Secondary | ICD-10-CM

## 2021-03-21 ENCOUNTER — Other Ambulatory Visit: Payer: Self-pay

## 2021-03-21 ENCOUNTER — Ambulatory Visit
Admission: RE | Admit: 2021-03-21 | Discharge: 2021-03-21 | Disposition: A | Discharge status: Home / Self Care | Payer: BC Managed Care – PPO | Source: Ambulatory Visit | Attending: Obstetrics and Gynecology | Admitting: Obstetrics and Gynecology

## 2021-03-21 DIAGNOSIS — Z803 Family history of malignant neoplasm of breast: Secondary | ICD-10-CM

## 2021-03-21 MED ORDER — GADOBUTROL 1 MMOL/ML IV SOLN
10.0000 mL | Freq: Once | INTRAVENOUS | Status: AC | PRN
  Administered 2021-03-21: 10 mL via INTRAVENOUS

## 2022-02-02 IMAGING — MR MR BREAST BILAT WO/W CM
8 of 12 series · 33 of 48 positions shown · IV contrast (10ml Gadavist)
Comparison: Bilateral screening mammogram dated 11/07/2020

CLINICAL DATA: Family history of breast cancer.

LABS:  None obtained on site today.
EXAM:
BILATERAL BREAST MRI WITH AND WITHOUT CONTRAST
TECHNIQUE: Multiplanar, multisequence MR images of both breasts were obtained
prior to and following the intravenous administration of 10 ml of
Gadavist

[Series 2: t2_tirm_tra ipat (a-p) · axial · 3.0mm · 0.78mm/px · 1 of 62 slices shown]
[im 1/62]
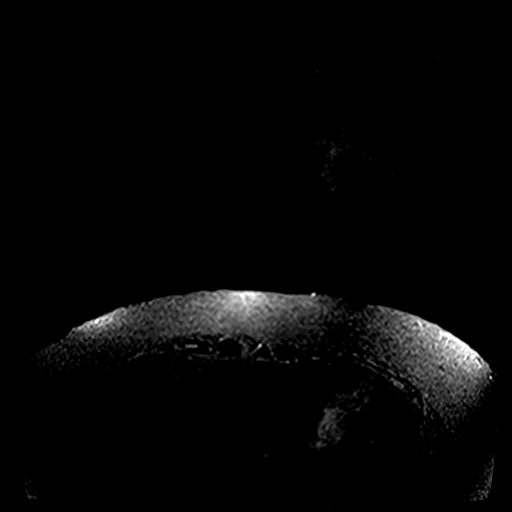

[Series 3: fl3d pre-cm no · axial · non-contrast · 1.2mm · 1.04mm/px · z∈[-89,+102]mm · 5 of 160 slices shown]
[im 1/160]
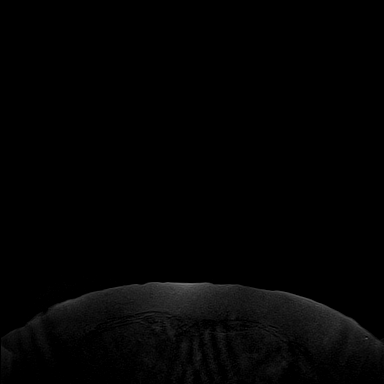
[im 40/160]
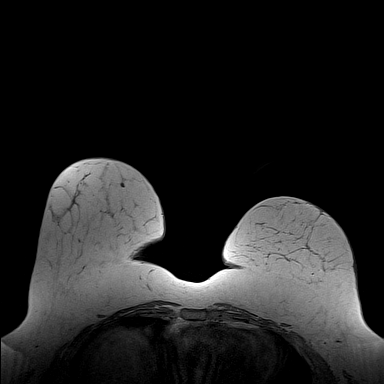
[im 80/160]
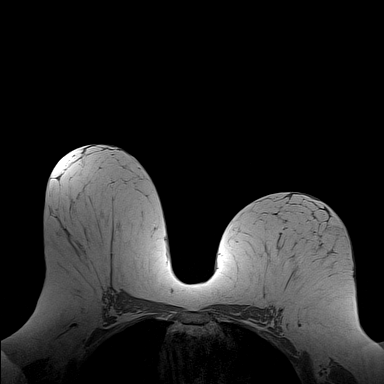
[im 120/160]
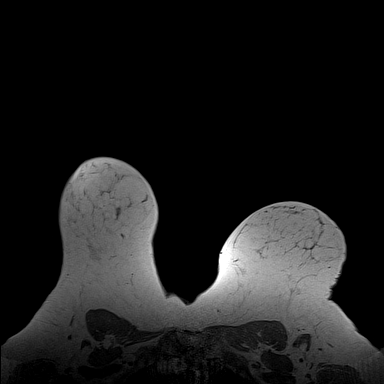
[im 160/160]
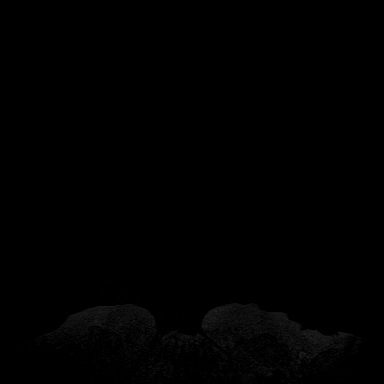

[Series 4: fl3d pre-cm · axial · non-contrast · 1.2mm · 1.04mm/px · z∈[-89,+102]mm · 5 of 160 slices shown]
[im 1/160]
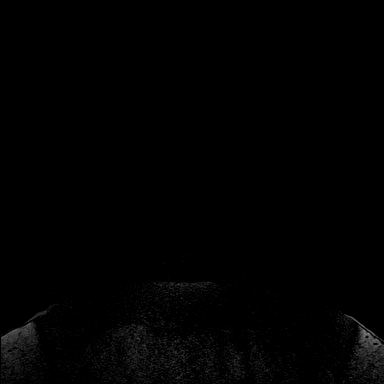
[im 40/160]
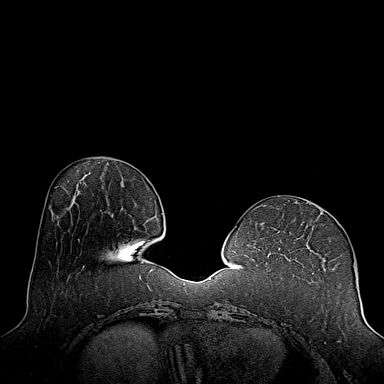
[im 80/160]
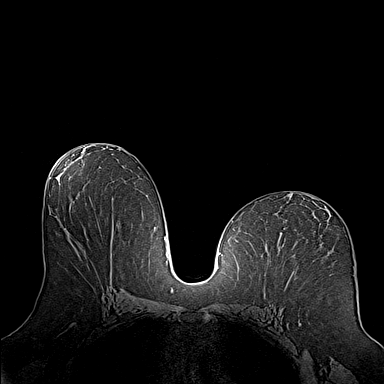
[im 120/160]
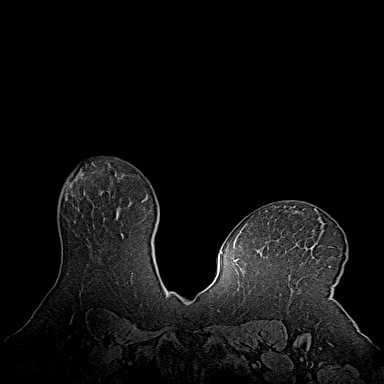
[im 160/160]
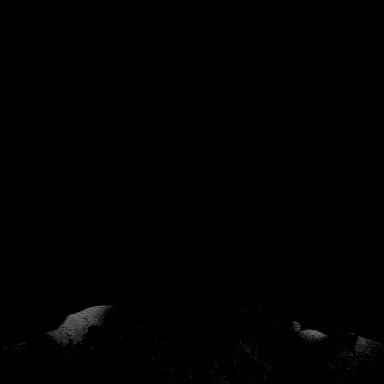

[Series 5: fl3d post-cm 20 · axial · 1.2mm · 1.04mm/px · z∈[-89,+102]mm · 5 of 160 slices shown (1 of 3)]
[im 1/160]
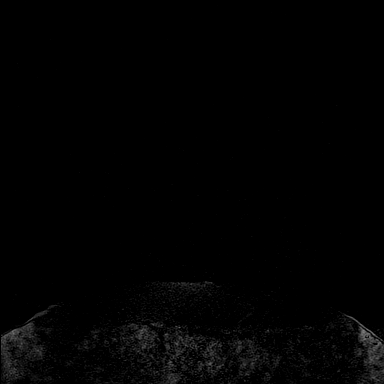
[im 40/160]
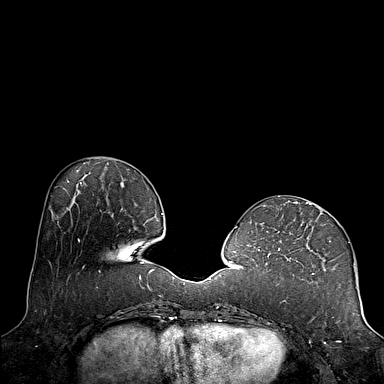
[im 80/160]
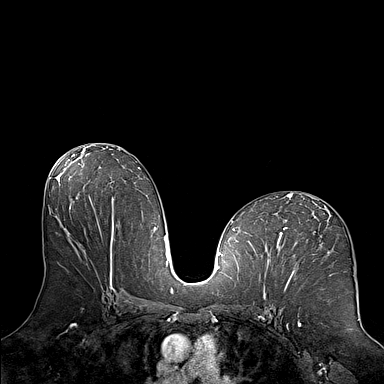
[im 120/160]
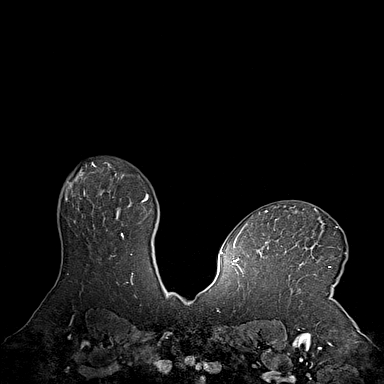
[im 160/160]
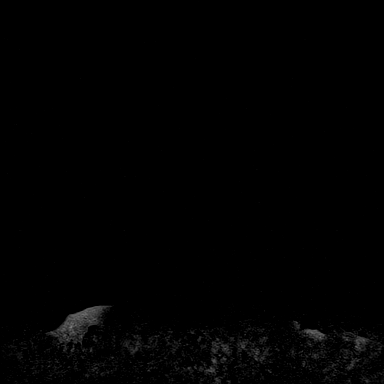

[Series 6: fl3d post-cm 20 · axial · 1.2mm · 1.04mm/px · z∈[-89,+102]mm · 5 of 160 slices shown (2 of 3)]
[im 1/160]
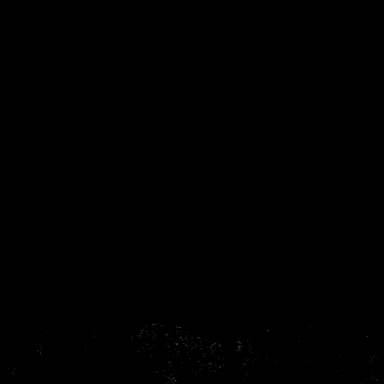
[im 40/160]
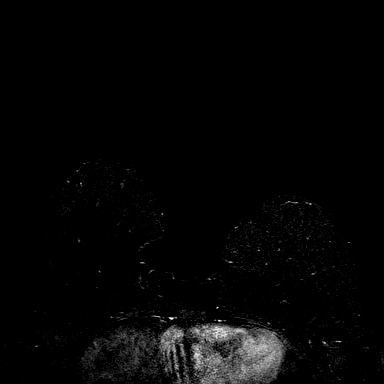
[im 80/160]
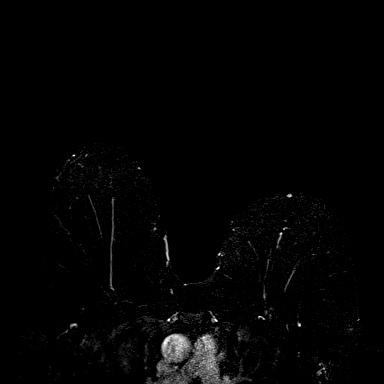
[im 120/160]
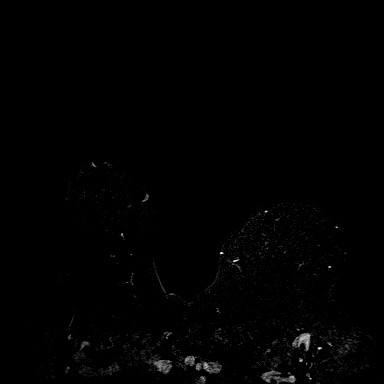
[im 160/160]
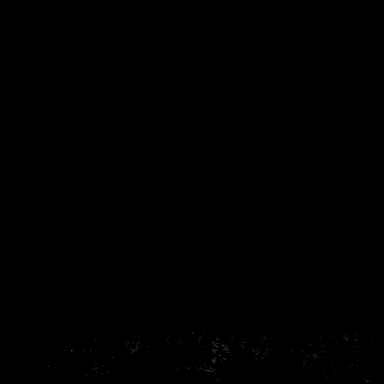

[Series 7: fl3d post-cm 20 · axial · 192.0mm · 1.04mm/px · 1 of 1 slices shown (3 of 3)]
[im 1/1]
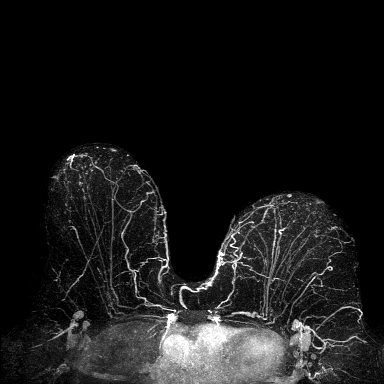

[Series 8: fl3d post-cm 3min · axial · 1.2mm · 1.04mm/px · z∈[-89,+102]mm · 6 of 160 slices shown]
[im 1/160]
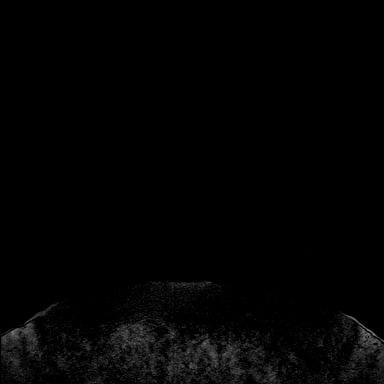
[im 32/160]
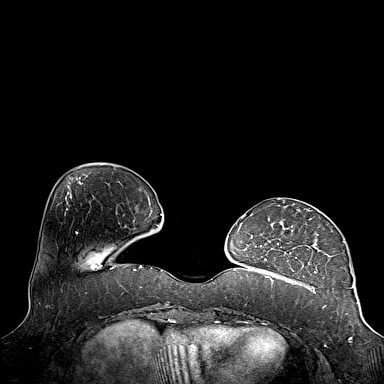
[im 64/160]
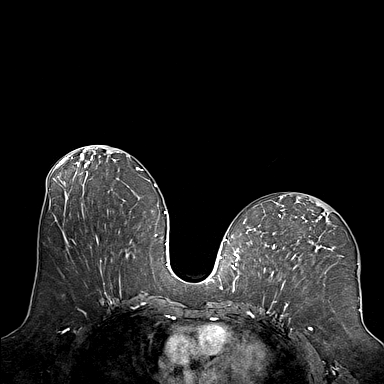
[im 96/160]
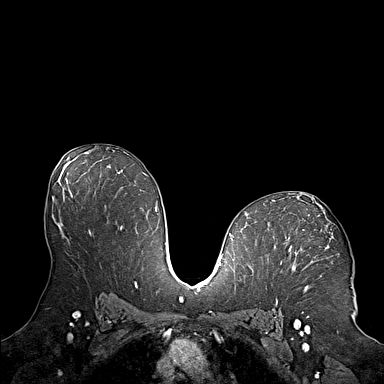
[im 128/160]
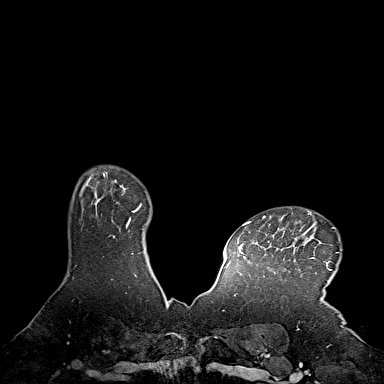
[im 160/160]
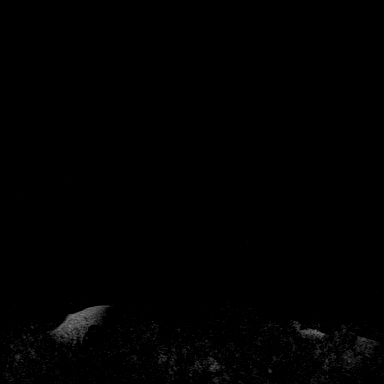

[Series 9: fl3d post-cm 3min_sub · axial · 1.2mm · 1.04mm/px · z∈[-89,+63]mm · 5 of 160 slices shown]
[im 1/160]
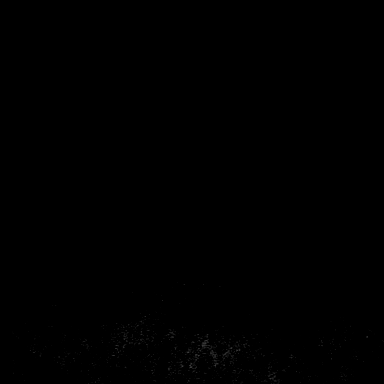
[im 32/160]
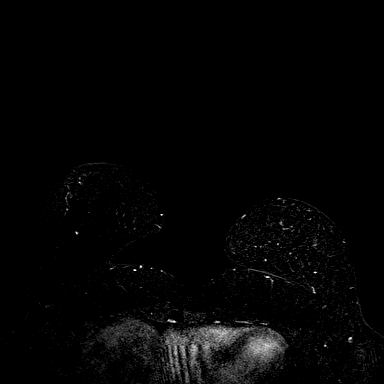
[im 64/160]
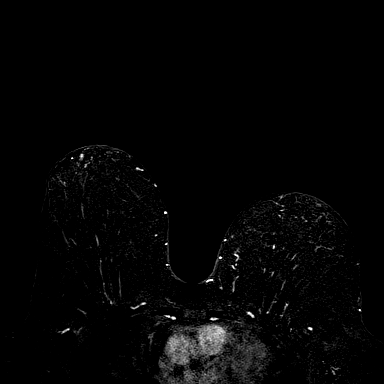
[im 96/160]
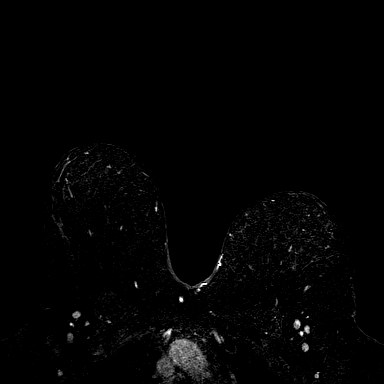
[im 128/160]
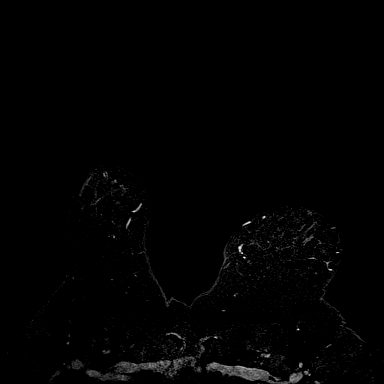

[33 of 48 positions shown; findings below may reference images not displayed]

Three-dimensional MR images were rendered by post-processing of the
original MR data on an independent workstation. The
three-dimensional MR images were interpreted, and findings are
reported in the following complete MRI report for this study. Three
dimensional images were evaluated at the independent interpreting
workstation using the DynaCAD thin client.
FINDINGS: Breast composition: b. Scattered fibroglandular tissue.

Background parenchymal enhancement: Minimal background parenchymal
nodular enhancement.

Right breast: No mass or abnormal enhancement.

Left breast: No mass or abnormal enhancement.

Lymph nodes: No abnormal appearing lymph nodes.

Ancillary findings:  None.
IMPRESSION: No evidence of malignancy.

RECOMMENDATION:
1. Bilateral screening mammogram in 7 months when due.
2. Per [employer] guidelines, if the patient has a
calculated lifetime risk of developing breast cancer of greater than
20%, annual screening MRI of the breasts would also be recommended.

BI-RADS CATEGORY  1: Negative.

## 8039-12-10 DEATH — deceased
